# Patient Record
Sex: Female | Born: 1970 | Race: Black or African American | Hispanic: No | Marital: Married | Smoking: Never smoker
Health system: Southern US, Community
[De-identification: ages and names within clinical notes are randomized; demographics above are authoritative.]

## PROBLEM LIST (undated history)

## (undated) DIAGNOSIS — Z8619 Personal history of other infectious and parasitic diseases: Secondary | ICD-10-CM

## (undated) DIAGNOSIS — F419 Anxiety disorder, unspecified: Secondary | ICD-10-CM

## (undated) DIAGNOSIS — E119 Type 2 diabetes mellitus without complications: Secondary | ICD-10-CM

## (undated) DIAGNOSIS — E785 Hyperlipidemia, unspecified: Secondary | ICD-10-CM

## (undated) DIAGNOSIS — J309 Allergic rhinitis, unspecified: Secondary | ICD-10-CM

## (undated) HISTORY — DX: Allergic rhinitis, unspecified: J30.9

## (undated) HISTORY — DX: Personal history of other infectious and parasitic diseases: Z86.19

## (undated) HISTORY — PX: PLEURAL SCARIFICATION: SHX748

## (undated) HISTORY — DX: Type 2 diabetes mellitus without complications: E11.9

## (undated) HISTORY — DX: Anxiety disorder, unspecified: F41.9

## (undated) HISTORY — DX: Hyperlipidemia, unspecified: E78.5

---

## 2016-10-15 LAB — HM PAP SMEAR

## 2017-03-15 ENCOUNTER — Ambulatory Visit: Payer: Self-pay | Admitting: Internal Medicine

## 2017-03-17 ENCOUNTER — Encounter: Payer: Self-pay | Admitting: Internal Medicine

## 2017-03-17 ENCOUNTER — Ambulatory Visit (INDEPENDENT_AMBULATORY_CARE_PROVIDER_SITE_OTHER): Payer: 59 | Admitting: Internal Medicine

## 2017-03-17 ENCOUNTER — Other Ambulatory Visit: Payer: Self-pay | Admitting: Internal Medicine

## 2017-03-17 VITALS — BP 116/78 | HR 81 | Temp 98.3°F | Wt 140.0 lb

## 2017-03-17 DIAGNOSIS — R232 Flushing: Secondary | ICD-10-CM | POA: Diagnosis not present

## 2017-03-17 DIAGNOSIS — Z794 Long term (current) use of insulin: Secondary | ICD-10-CM

## 2017-03-17 DIAGNOSIS — E7800 Pure hypercholesterolemia, unspecified: Secondary | ICD-10-CM

## 2017-03-17 DIAGNOSIS — E119 Type 2 diabetes mellitus without complications: Secondary | ICD-10-CM | POA: Diagnosis not present

## 2017-03-17 DIAGNOSIS — F419 Anxiety disorder, unspecified: Secondary | ICD-10-CM | POA: Diagnosis not present

## 2017-03-17 DIAGNOSIS — Z1239 Encounter for other screening for malignant neoplasm of breast: Secondary | ICD-10-CM

## 2017-03-17 DIAGNOSIS — E785 Hyperlipidemia, unspecified: Secondary | ICD-10-CM | POA: Insufficient documentation

## 2017-03-17 DIAGNOSIS — E1169 Type 2 diabetes mellitus with other specified complication: Secondary | ICD-10-CM | POA: Insufficient documentation

## 2017-03-17 DIAGNOSIS — Z23 Encounter for immunization: Secondary | ICD-10-CM

## 2017-03-17 NOTE — Progress Notes (Signed)
HPI  Pt presents to the clinic today to establish care and for management of the conditions listed below. She is transferring care from [facility].  Anxiety: She is not sure what triggers her anxiety but reports it is very bad during periods of stress. She takes Clonazepam as needed. She reports 30 tablets of Clonazepam lasts her a whole year usually.  DM 2: Her last A1C was 7.4%. She is taking Metformin, Lantus and Humalog as prescribed. She is on Lisinopril for renal protection. She denies neuropathic pain at this time. She gets an eye exam yearly.  HLD: She denies myalgias on Lovastatin. She consumes a low fat diet.  Hot Flashes; Controlled with Gabapentin.  Flu: 03/2016 Tetanus: 2013 Pneumovax: 2015 Mammogram: 06/2014 Pap Smear: Vision Screening: annually Dentist: annually  Past Medical History:  Diagnosis Date  . Anxiety   . Diabetes mellitus without complication ([place])   . History of chicken pox   . Hyperlipidemia     Current Outpatient Prescriptions  Medication Sig Dispense Refill  . cholecalciferol (VITAMIN D) 400 units TABS tablet Take 400 Units by mouth.    . clonazePAM (KLONOPIN) 0.5 MG tablet Take 0.5 mg by mouth 2 (two) times daily as needed.   0  . gabapentin (NEURONTIN) 100 MG capsule Take 300 capsules by mouth at bedtime.   3  . glucose blood (ONE TOUCH ULTRA TEST) test strip Use 2 (two) times daily.    . insulin lispro (HUMALOG) 100 UNIT/ML KiwkPen Inject 5-7 Units into the skin daily with supper.     Marland Kitchen LANTUS SOLOSTAR 100 UNIT/ML Solostar Pen Inject 14 Units into the skin daily at 10 pm.   0  . lisinopril (PRINIVIL,ZESTRIL) 10 MG tablet Take by mouth.    . lovastatin (MEVACOR) 40 MG tablet Take 1 tablet by mouth at bedtime.    . metFORMIN (GLUCOPHAGE) 500 MG tablet Take 2 tablets by mouth 2 (two) times daily with a meal.     No current facility-administered medications for this visit.     No Known Allergies  Family History  Problem Relation Age of  Onset  . Arthritis Mother   . Heart disease Mother   . Hypertension Mother   . Hyperlipidemia Mother   . Dementia Mother   . Hypertension Father   . Hypertension Sister   . Glaucoma Brother   . Asthma Maternal Grandmother   . Stomach cancer Maternal Grandfather   . Arthritis Paternal Grandmother     Social History   Social History  . Marital status: Married    Spouse name: N/A  . Number of children: N/A  . Years of education: N/A   Occupational History  . Not on file.   Social History Main Topics  . Smoking status: Never Smoker  . Smokeless tobacco: Never Used  . Alcohol use Yes     Comment: occasional  . Drug use: No  . Sexual activity: Not on file   Other Topics Concern  . Not on file   Social History Narrative  . No narrative on file    ROS:  Constitutional: Denies fever, malaise, fatigue, headache or abrupt weight changes.  HEENT: Denies eye pain, eye redness, ear pain, ringing in the ears, wax buildup, runny nose, nasal congestion, bloody nose, or sore throat. Respiratory: Denies difficulty breathing, shortness of breath, cough or sputum production.   Cardiovascular: Denies chest pain, chest tightness, palpitations or swelling in the hands or feet.  Gastrointestinal: Denies abdominal pain, bloating, constipation, diarrhea  or blood in the stool.  GU: Denies frequency, urgency, pain with urination, blood in urine, odor or discharge. Musculoskeletal: Denies decrease in range of motion, difficulty with gait, muscle pain or joint pain and swelling.  Skin: Denies redness, rashes, lesions or ulcercations.  Neurological: Pt reports hot flashes. Denies dizziness, difficulty with memory, difficulty with speech or problems with balance and coordination.  Psych: Pt reports anxiety. Denies depression, SI/HI.  No other specific complaints in a complete review of systems (except as listed in HPI above).  PE:  BP 116/78   Pulse 81   Temp 98.3 F (36.8 C) (Oral)   Wt  140 lb (63.5 kg)   LMP 09/21/2016   SpO2 98%  Wt Readings from Last 3 Encounters:  03/17/17 140 lb (63.5 kg)    General: Appears her stated age, well developed, well nourished in NAD. Skin: Dry and intact. No ulcerations noted. Cardiovascular: Normal rate and rhythm. S1,S2 noted.  No murmur, rubs or gallops noted.  Pulmonary/Chest: Normal effort and positive vesicular breath sounds. No respiratory distress. No wheezes, rales or ronchi noted.  Neurological: Alert and oriented.  Psychiatric: Mood and affect normal. Behavior is normal. Judgment and thought content normal.     Assessment and Plan:

## 2017-03-17 NOTE — Assessment & Plan Note (Signed)
A1C today No microalbumin secondary to ACEI therapy Encouraged her to consume a low carb, low fat diet Continue Metformin, Humalog and Lantus Encouraged yearly foot exam Encouraged yearly eye exam Flu shot today Pneumovax UTD

## 2017-03-17 NOTE — Assessment & Plan Note (Signed)
Continue Gabapentin as needed

## 2017-03-17 NOTE — Patient Instructions (Signed)

## 2017-03-17 NOTE — Assessment & Plan Note (Signed)
Encouraged her to consume a low fat diet Continue Lovastatin for now

## 2017-03-17 NOTE — Assessment & Plan Note (Signed)
Controlled on Clonazepam prn

## 2017-03-18 LAB — HEMOGLOBIN A1C: HEMOGLOBIN A1C: 8.4 % — AB (ref 4.6–6.5)

## 2017-03-31 ENCOUNTER — Ambulatory Visit
Admission: RE | Admit: 2017-03-31 | Discharge: 2017-03-31 | Disposition: A | Discharge status: Home / Self Care | Payer: 59 | Source: Ambulatory Visit | Attending: Internal Medicine | Admitting: Internal Medicine

## 2017-03-31 DIAGNOSIS — Z1231 Encounter for screening mammogram for malignant neoplasm of breast: Secondary | ICD-10-CM | POA: Insufficient documentation

## 2017-03-31 DIAGNOSIS — Z1239 Encounter for other screening for malignant neoplasm of breast: Secondary | ICD-10-CM

## 2017-05-23 ENCOUNTER — Encounter: Payer: Self-pay | Admitting: Internal Medicine

## 2017-05-24 MED ORDER — METFORMIN HCL 1000 MG PO TABS: 1000.0000 mg | ORAL_TABLET | Freq: Two times a day (BID) | ORAL | 1 refills | Status: DC

## 2017-05-24 MED ORDER — LISINOPRIL 10 MG PO TABS: 10.0000 mg | ORAL_TABLET | Freq: Every day | ORAL | 1 refills | Status: DC

## 2017-05-24 MED ORDER — INSULIN LISPRO 100 UNIT/ML (KWIKPEN): 5.0000 [IU] | PEN_INJECTOR | Freq: Every day | SUBCUTANEOUS | 1 refills | Status: DC

## 2017-05-24 MED ORDER — LANTUS SOLOSTAR 100 UNIT/ML ~~LOC~~ SOPN: 16.0000 [IU] | PEN_INJECTOR | Freq: Every day | SUBCUTANEOUS | 1 refills | Status: DC

## 2017-05-24 MED ORDER — CLONAZEPAM 0.5 MG PO TABS
0.5000 mg | ORAL_TABLET | Freq: Two times a day (BID) | ORAL | 0 refills | Status: DC | PRN
Start: 2017-05-24 — End: 2017-12-08

## 2017-05-24 MED ORDER — GABAPENTIN 100 MG PO CAPS: 300.0000 mg | ORAL_CAPSULE | Freq: Every day | ORAL | 4 refills | Status: DC

## 2017-05-24 NOTE — Telephone Encounter (Signed)
Ok to phone in Clonazepam

## 2017-05-24 NOTE — Telephone Encounter (Signed)
Rx called in to requested pharmacy 

## 2017-05-24 NOTE — Telephone Encounter (Signed)
New pt to you 02/2017... Please advise if okay to refill... It does not look like it has been filled by you yet

## 2017-06-02 ENCOUNTER — Encounter: Payer: Self-pay | Admitting: Obstetrics and Gynecology

## 2017-06-02 ENCOUNTER — Ambulatory Visit (INDEPENDENT_AMBULATORY_CARE_PROVIDER_SITE_OTHER): Payer: 59 | Admitting: Obstetrics and Gynecology

## 2017-06-02 VITALS — BP 122/78 | Ht 68.0 in | Wt 144.0 lb

## 2017-06-02 DIAGNOSIS — R232 Flushing: Secondary | ICD-10-CM | POA: Diagnosis not present

## 2017-06-02 MED ORDER — PROGESTERONE MICRONIZED 200 MG PO CAPS: 200.0000 mg | ORAL_CAPSULE | Freq: Every day | ORAL | 2 refills | Status: DC

## 2017-06-02 MED ORDER — ESTRADIOL 0.025 MG/24HR TD PTWK: 0.0250 mg | MEDICATED_PATCH | TRANSDERMAL | 2 refills | Status: DC

## 2017-06-02 NOTE — Progress Notes (Signed)
Obstetrics & Gynecology Office Visit   Chief Complaint  Patient presents with  . Menopause   History of Present Illness: 46 y.o. G0P0000 female who presents with moderate-severe hot flushes.  She had her last menses in April this past year.  Since that time she has had fairly severe hot flashes.  Her PCP placed her on gabapentin due to some foot pain thought to be peripheral neuropathy, related to her diabetes.  She found that a dose of up to 300 mg of gabapentin would help with her hot flush symptoms.  This happened for a short while.  However, the symptoms returned and are very difficult to manage.  The patient has diabetes and according to her report, her diabetes is usually well-controlled.  She is taking an ACE inhibitor medication due to microalbuminuria. However, she has no hypertension.  She denies issues with liver disease. She has never had a stroke and denies CHD.  She has no history of breast cancer.    Past Medical History:  Diagnosis Date  . Anxiety   . Diabetes mellitus without complication ([place])   . History of chicken pox   . Hyperlipidemia     Past Surgical History:  Procedure Laterality Date  . PLEURAL SCARIFICATION      Gynecologic History: No LMP recorded. Patient is not currently having periods (Reason: Irregular Periods).  Obstetric History: G0P0000  Family History  Problem Relation Age of Onset  . Arthritis Mother   . Heart disease Mother   . Hypertension Mother   . Hyperlipidemia Mother   . Dementia Mother   . Hypertension Father   . Hypertension Sister   . Glaucoma Brother   . Asthma Maternal Grandmother   . Stomach cancer Maternal Grandfather   . Arthritis Paternal Grandmother   . Breast cancer Neg Hx     Social History   Socioeconomic History  . Marital status: Married    Spouse name: Not on file  . Number of children: Not on file  . Years of education: Not on file  . Highest education level: Not on file  Social Needs  . Financial  resource strain: Not on file  . Food insecurity - worry: Not on file  . Food insecurity - inability: Not on file  . Transportation needs - medical: Not on file  . Transportation needs - non-medical: Not on file  Occupational History  . Not on file  Tobacco Use  . Smoking status: Never Smoker  . Smokeless tobacco: Never Used  Substance and Sexual Activity  . Alcohol use: Yes    Comment: occasional  . Drug use: No  . Sexual activity: Yes  Other Topics Concern  . Not on file  Social History Narrative  . Not on file   Allergies: No Known Allergies  Medications   Medication Sig Start Date End Date Taking? Authorizing Provider  cholecalciferol (VITAMIN D) 400 units TABS tablet Take 400 Units by mouth.    [provider]  clonazePAM (KLONOPIN) 0.5 MG tablet Take 1 tablet (0.5 mg total) by mouth 2 (two) times daily as needed. 05/24/17   Jearld Fenton, NP  gabapentin (NEURONTIN) 100 MG capsule Take 3 capsules (300 mg total) by mouth at bedtime. 05/24/17   Jearld Fenton, NP  glucose blood (ONE TOUCH ULTRA TEST) test strip Use 2 (two) times daily. 06/23/16   [provider]  insulin lispro (HUMALOG) 100 UNIT/ML KiwkPen Inject 0.05-0.07 mLs (5-7 Units total) into the skin daily with supper.  05/24/17   Jearld Fenton, NP  LANTUS SOLOSTAR 100 UNIT/ML Solostar Pen Inject 16 Units into the skin daily at 10 pm. 05/24/17   Jearld Fenton, NP  lisinopril (PRINIVIL,ZESTRIL) 10 MG tablet Take 1 tablet (10 mg total) by mouth daily. 05/24/17   Jearld Fenton, NP  lovastatin (MEVACOR) 40 MG tablet Take 1 tablet by mouth at bedtime. 06/23/16   [provider]  metFORMIN (GLUCOPHAGE) 1000 MG tablet Take 1 tablet (1,000 mg total) by mouth 2 (two) times daily with a meal. 05/24/17   Baity, Coralie Keens, NP   Review of Systems  Constitutional: Negative.        Occasional hot flushes  HENT: Negative.   Eyes: Negative.   Respiratory: Negative.   Cardiovascular: Negative.     Gastrointestinal: Negative.   Genitourinary: Negative.   Musculoskeletal: Negative.   Skin: Negative.   Neurological: Negative.   Psychiatric/Behavioral: Negative.      Physical Exam BP 122/78   Ht 5' 8"  (1.727 m)   Wt 144 lb (65.3 kg)   BMI 21.90 kg/m  No LMP recorded. Patient is not currently having periods (Reason: Irregular Periods). Physical Exam  Constitutional: She is oriented to person, place, and time. She appears well-developed and well-nourished. No distress.  HENT:  Head: Normocephalic and atraumatic.  Neurological: She is alert and oriented to person, place, and time. No cranial nerve deficit.  Psychiatric: She has a normal mood and affect. Her behavior is normal. Judgment normal.   Assessment: 46 y.o. G0P0000 female here for  1. Hot flashes      Plan: Problem List Items Addressed This Visit    Hot flashes - Primary   Relevant Medications   progesterone (PROMETRIUM) 200 MG capsule   estradiol (CLIMARA) 0.025 mg/24hr patch     30 minutes spent in face to face discussion with > 50% spent in counseling, management, and coordination of care of her hot flashes and climacteric symptoms.  She understands the various options available for treatment and has tried gabapentin already. She understands the SSRIs, SNRIs, clonidine, and herbal products available.  We discussed, briefly, some of the non-hot-flash benefits/risks of the medication.  Her 10-year CHD risk is 3.87% strictly speaking, but she does not have hypertension specifically. She is well-below the threshold of 10% 10-year risk.  Will start her off with a low-dose estrogen patch with add-back oral progesterone therapy for endometrial protection.  Will have her follow up and let me know how her symptoms are responding in about 1 month, at which time her dosing may need to be increased to relieve her symptoms.  All questions answered.   Prentice Docker, MD 06/03/2017 1:15 PM

## 2017-06-03 ENCOUNTER — Encounter: Payer: Self-pay | Admitting: Obstetrics and Gynecology

## 2017-07-11 ENCOUNTER — Encounter: Payer: Self-pay | Admitting: Obstetrics and Gynecology

## 2017-07-21 ENCOUNTER — Encounter: Payer: 59 | Admitting: Internal Medicine

## 2017-08-18 ENCOUNTER — Other Ambulatory Visit: Payer: Self-pay | Admitting: Obstetrics and Gynecology

## 2017-08-18 DIAGNOSIS — R232 Flushing: Secondary | ICD-10-CM

## 2017-08-25 ENCOUNTER — Telehealth: Payer: Self-pay

## 2017-08-25 ENCOUNTER — Other Ambulatory Visit: Payer: Self-pay | Admitting: Advanced Practice Midwife

## 2017-08-25 ENCOUNTER — Ambulatory Visit: Payer: Self-pay | Admitting: Family Medicine

## 2017-08-25 DIAGNOSIS — R232 Flushing: Secondary | ICD-10-CM

## 2017-08-25 MED ORDER — PROGESTERONE MICRONIZED 200 MG PO CAPS: 200.0000 mg | ORAL_CAPSULE | Freq: Every day | ORAL | 0 refills | Status: DC

## 2017-08-25 MED ORDER — ESTRADIOL 0.025 MG/24HR TD PTWK: 0.0250 mg | MEDICATED_PATCH | TRANSDERMAL | 0 refills | Status: DC

## 2017-08-25 NOTE — Telephone Encounter (Signed)
Pt now has appt sched for 3/13th.  Needs refill of estrogen patch and progesterone.  [phone] (pharm correct in chart)

## 2017-08-25 NOTE — Progress Notes (Signed)
Rx sent for estrogen patch and progesterone capsules enough until next appointment. Will need further refills at appointment time.

## 2017-08-25 NOTE — Telephone Encounter (Signed)
Spoke with patient and let her know that Rx is at her pharmacy.

## 2017-08-25 NOTE — Telephone Encounter (Signed)
08/25/2017- provider called in sick. Called pt to reschedule new pt appointment. States she can only come in on Wednesdays, and next available Wednesday is 10/06/2017 (appt scheduled). Pt needed refill on medications- Lovastatin 40 mg, and estradiol 0.025 mg/24hr patch. [facility]. OK to refill? Pt aware this will wait until provider's return.

## 2017-08-26 MED ORDER — LOVASTATIN 40 MG PO TABS: 40.0000 mg | ORAL_TABLET | Freq: Every day | ORAL | 0 refills | Status: DC

## 2017-08-26 MED ORDER — ESTRADIOL 0.025 MG/24HR TD PTWK: 0.0250 mg | MEDICATED_PATCH | TRANSDERMAL | 0 refills | Status: DC

## 2017-08-26 NOTE — Telephone Encounter (Signed)
I'm ok with refilling meds for 1 month supply until she can get in (Rx sent to pharmacy).  She also could get scheduled in an 8 AM slot (as an extra new patient for the day on next Wednesday if she wants to come in sooner, as it was my fault her appt was rescheduled.  Virginia Crews, MD, MPH [facility] 08/26/2017 8:17 AM

## 2017-08-26 NOTE — Telephone Encounter (Signed)
Left message advising pt.

## 2017-09-01 ENCOUNTER — Encounter: Payer: Self-pay | Admitting: Obstetrics and Gynecology

## 2017-09-01 ENCOUNTER — Ambulatory Visit: Payer: 59 | Admitting: Obstetrics and Gynecology

## 2017-09-01 VITALS — BP 128/88 | Ht 68.0 in | Wt 145.0 lb

## 2017-09-01 DIAGNOSIS — R232 Flushing: Secondary | ICD-10-CM | POA: Diagnosis not present

## 2017-09-01 MED ORDER — ESTRADIOL 0.025 MG/24HR TD PTWK: 0.0250 mg | MEDICATED_PATCH | TRANSDERMAL | 11 refills | Status: DC

## 2017-09-01 MED ORDER — PROGESTERONE MICRONIZED 200 MG PO CAPS: 200.0000 mg | ORAL_CAPSULE | Freq: Every day | ORAL | 11 refills | Status: DC

## 2017-09-01 NOTE — Progress Notes (Signed)
Obstetrics & Gynecology Office Visit   Chief Complaint  Patient presents with  . Follow-up  hot flash medication management  History of Present Illness: 47 y.o. G0P0000 female who was started on prometrium and estradiol (0.015m/24h patch) in December.  She reports that the medication has been working very well.  She states her symptoms are improved.  Her number of hot flashes is greatly reduced. She has more hot flashes during the treatment with progesterone.  She has no side effects from the medications. She had a withdrawal bleed after her first round of taking the progesterone in January.  She would like to continue with her current dose.  Past Medical History:  Diagnosis Date  . Anxiety   . Diabetes mellitus without complication ([place]   . History of chicken pox   . Hyperlipidemia     Past Surgical History:  Procedure Laterality Date  . PLEURAL SCARIFICATION      Gynecologic History: No LMP recorded. Patient is not currently having periods (Reason: Irregular Periods).  Obstetric History: G0P0000  Family History  Problem Relation Age of Onset  . Arthritis Mother   . Heart disease Mother   . Hypertension Mother   . Hyperlipidemia Mother   . Dementia Mother   . Hypertension Father   . Hypertension Sister   . Glaucoma Brother   . Asthma Maternal Grandmother   . Stomach cancer Maternal Grandfather   . Arthritis Paternal Grandmother   . Breast cancer Neg Hx     Social History   Socioeconomic History  . Marital status: Married    Spouse name: Not on file  . Number of children: Not on file  . Years of education: Not on file  . Highest education level: Not on file  Social Needs  . Financial resource strain: Not on file  . Food insecurity - worry: Not on file  . Food insecurity - inability: Not on file  . Transportation needs - medical: Not on file  . Transportation needs - non-medical: Not on file  Occupational History  . Not on file  Tobacco Use  . Smoking  status: Never Smoker  . Smokeless tobacco: Never Used  Substance and Sexual Activity  . Alcohol use: Yes    Comment: occasional  . Drug use: No  . Sexual activity: Yes  Other Topics Concern  . Not on file  Social History Narrative  . Not on file   Allergies: No Known Allergies  Prior to Admission medications   Medication Sig Start Date End Date Taking? Authorizing Provider  cholecalciferol (VITAMIN D) 400 units TABS tablet Take 400 Units by mouth.    [provider]  clonazePAM (KLONOPIN) 0.5 MG tablet Take 1 tablet (0.5 mg total) by mouth 2 (two) times daily as needed. 05/24/17   BJearld Fenton NP  estradiol (CLIMARA) 0.025 mg/24hr patch Place 1 patch (0.025 mg total) onto the skin once a week. 08/26/17   Bacigalupo, ADionne Bucy MD  gabapentin (NEURONTIN) 100 MG capsule Take 3 capsules (300 mg total) by mouth at bedtime. 05/24/17   BJearld Fenton NP  glucose blood (ONE TOUCH ULTRA TEST) test strip Use 2 (two) times daily. 06/23/16   [provider]  insulin lispro (HUMALOG) 100 UNIT/ML KiwkPen Inject 0.05-0.07 mLs (5-7 Units total) into the skin daily with supper. 05/24/17   BJearld Fenton NP  LANTUS SOLOSTAR 100 UNIT/ML Solostar Pen Inject 16 Units into the skin daily at 10 pm. 05/24/17   BJearld Fenton  NP  lisinopril (PRINIVIL,ZESTRIL) 10 MG tablet Take 1 tablet (10 mg total) by mouth daily. 05/24/17   Jearld Fenton, NP  lovastatin (MEVACOR) 40 MG tablet Take 1 tablet (40 mg total) by mouth at bedtime. 08/26/17   Virginia Crews, MD  metFORMIN (GLUCOPHAGE) 1000 MG tablet Take 1 tablet (1,000 mg total) by mouth 2 (two) times daily with a meal. 05/24/17   Baity, Coralie Keens, NP  progesterone (PROMETRIUM) 200 MG capsule Take 1 capsule (200 mg total) by mouth daily. Take calendar days 1-12 each month 08/25/17   Rod Can, CNM    Review of Systems  Constitutional: Negative.   HENT: Negative.   Eyes: Negative.   Respiratory: Negative.   Cardiovascular: Negative.     Gastrointestinal: Negative.   Genitourinary: Negative.   Musculoskeletal: Negative.   Skin: Negative.   Neurological: Negative.   Psychiatric/Behavioral: Negative.      Physical Exam BP 128/88   Ht 5' 8"  (1.727 m)   Wt 145 lb (65.8 kg)   BMI 22.05 kg/m  No LMP recorded. Patient is not currently having periods (Reason: Irregular Periods). Physical Exam  Constitutional: She is oriented to person, place, and time. She appears well-developed and well-nourished. No distress.  HENT:  Head: Normocephalic and atraumatic.  Eyes: Conjunctivae are normal. No scleral icterus.  Pulmonary/Chest: Effort normal. No respiratory distress.  Musculoskeletal: Normal range of motion. She exhibits no edema.  Neurological: She is alert and oriented to person, place, and time. No cranial nerve deficit.  Psychiatric: She has a normal mood and affect. Her behavior is normal. Judgment normal.   Assessment: 47 y.o. G0P0000 female here for  1. Hot flashes      Plan: Problem List Items Addressed This Visit      Cardiovascular and Mediastinum   Hot flashes - Primary   Relevant Medications   estradiol (CLIMARA) 0.025 mg/24hr patch   progesterone (PROMETRIUM) 200 MG capsule     15 minutes spent in face to face discussion with > 50% spent in counseling,management, and coordination of care of her hot flashes.  1 year supply given for both medications.  Return in about 1 year (around 09/02/2018) for follow up medication management.  Prentice Docker, MD 09/01/2017 9:36 AM

## 2017-10-06 ENCOUNTER — Ambulatory Visit: Payer: 59 | Admitting: Family Medicine

## 2017-10-06 ENCOUNTER — Encounter: Payer: Self-pay | Admitting: Family Medicine

## 2017-10-06 VITALS — BP 122/84 | HR 68 | Temp 97.7°F | Resp 16 | Ht 68.0 in | Wt 145.0 lb

## 2017-10-06 DIAGNOSIS — F419 Anxiety disorder, unspecified: Secondary | ICD-10-CM

## 2017-10-06 DIAGNOSIS — E785 Hyperlipidemia, unspecified: Secondary | ICD-10-CM | POA: Insufficient documentation

## 2017-10-06 DIAGNOSIS — E119 Type 2 diabetes mellitus without complications: Secondary | ICD-10-CM

## 2017-10-06 DIAGNOSIS — E7800 Pure hypercholesterolemia, unspecified: Secondary | ICD-10-CM | POA: Diagnosis not present

## 2017-10-06 DIAGNOSIS — Z794 Long term (current) use of insulin: Secondary | ICD-10-CM | POA: Diagnosis not present

## 2017-10-06 DIAGNOSIS — J309 Allergic rhinitis, unspecified: Secondary | ICD-10-CM | POA: Insufficient documentation

## 2017-10-06 DIAGNOSIS — J301 Allergic rhinitis due to pollen: Secondary | ICD-10-CM | POA: Diagnosis not present

## 2017-10-06 DIAGNOSIS — R232 Flushing: Secondary | ICD-10-CM | POA: Diagnosis not present

## 2017-10-06 LAB — POCT GLYCOSYLATED HEMOGLOBIN (HGB A1C)
Est. average glucose Bld gHb Est-mCnc: 169
HEMOGLOBIN A1C: 7.5

## 2017-10-06 MED ORDER — LISINOPRIL 10 MG PO TABS: 10.0000 mg | ORAL_TABLET | Freq: Every day | ORAL | 3 refills | Status: DC

## 2017-10-06 MED ORDER — GABAPENTIN 400 MG PO CAPS: 400.0000 mg | ORAL_CAPSULE | Freq: Every day | ORAL | 3 refills | Status: DC

## 2017-10-06 MED ORDER — LANTUS SOLOSTAR 100 UNIT/ML ~~LOC~~ SOPN: 14.0000 [IU] | PEN_INJECTOR | Freq: Every day | SUBCUTANEOUS | 1 refills | Status: DC

## 2017-10-06 MED ORDER — DULAGLUTIDE 0.75 MG/0.5ML ~~LOC~~ SOAJ: 0.7500 mg | SUBCUTANEOUS | 3 refills | Status: DC

## 2017-10-06 NOTE — Assessment & Plan Note (Signed)
Well-controlled Continue daily Zyrtec

## 2017-10-06 NOTE — Assessment & Plan Note (Signed)
Well-controlled Patient declines SSRI at this time Continue clonazepam as needed with using sparingly

## 2017-10-06 NOTE — Progress Notes (Signed)
Patient: Lynn Coleman, Female    DOB: 1971-06-10, 47 y.o.   MRN: [id number] Visit Date: 10/06/2017  Today's Provider: Lavon Paganini, MD   I, Martha Clan, CMA, am acting as scribe for Lavon Paganini, MD.  Chief Complaint  Patient presents with  . Establish Care   Subjective:    Establish Care Lynn Coleman is a 47 y.o. female who presents today to establish care. She feels well. She reports exercising 3 days per week for 30 minutes on the elliptical. She reports she is sleeping fairly well.  Pt's PMH includes DM 2, hypercholesterolemia, anxiety, microalbuminuria. She states she takes Gabapentin for hot flashes, and would like to increase this dose.  T2DM - Medications: Metformin 1048m BID, Lantus 14 units qhs, Humalog 5-7 units 1-3 times daily with meals (usually only with dinner which is her largest meal) - Compliance: good - Diet: low carb - eye exam: scheduled for next month with [facility] - foot exam: needs - microalbumin: n/a on ACEi - denies symptoms of hypoglycemia, polyuria, polydipsia, numbness extremities, foot ulcers/trauma  Anxiety: Previously taking Lexapro in times of high stress and anxiety.  Anxiety is pretty stable currently.  Using Klonopin irregularly for high anxiety times.  Perimenopausal with vasomotor symptoms: Taking Gabapentin 3013mqhs, which helps some but not completely.  GYN is prescribing HRT.  Patient has intact uterus.  Last pap- 10/15/2016- NIL Last mammogram- 03/31/2017- BI-RADS 1 -----------------------------------------------------------------   Review of Systems  Constitutional: Negative.   HENT: Negative.   Eyes: Negative.   Respiratory: Negative.   Cardiovascular: Negative.   Gastrointestinal: Negative.   Endocrine: Negative.   Genitourinary: Negative.   Musculoskeletal: Positive for back pain. Negative for arthralgias, gait problem, joint swelling, myalgias, neck pain and neck stiffness.  Skin:  Negative.   Allergic/Immunologic: Negative.   Neurological: Negative.   Hematological: Negative.   Psychiatric/Behavioral: Negative.     Social History      She  reports that she has never smoked. She has never used smokeless tobacco. She reports that she drinks alcohol. She reports that she does not use drugs.       Social History   Socioeconomic History  . Marital status: Married    Spouse name: Darrell  . Number of children: 0  . Years of education: Not on file  . Highest education level: Doctorate  Occupational History    Employer: [employer]  Social Needs  . Financial resource strain: Not hard at all  . Food insecurity:    Worry: Never true    Inability: Never true  . Transportation needs:    Medical: No    Non-medical: No  Tobacco Use  . Smoking status: Never Smoker  . Smokeless tobacco: Never Used  Substance and Sexual Activity  . Alcohol use: Yes    Comment: occasional  . Drug use: No  . Sexual activity: Yes  Lifestyle  . Physical activity:    Days per week: 3 days    Minutes per session: 30 min  . Stress: Not on file  Relationships  . Social connections:    Talks on phone: Not on file    Gets together: Not on file    Attends religious service: Not on file    Active member of club or organization: Not on file    Attends meetings of clubs or organizations: Not on file    Relationship status: Not on file  Other Topics Concern  . Not on file  Social History Narrative  . Not on file    Past Medical History:  Diagnosis Date  . Anxiety   . Diabetes mellitus without complication ([place])   . History of chicken pox   . Hyperlipidemia      Patient Active Problem List   Diagnosis Date Noted  . Type 2 diabetes mellitus without complication, with long-term current use of insulin ([place]) 03/17/2017  . Anxiety 03/17/2017  . HLD (hyperlipidemia) 03/17/2017  . Hot flashes 03/17/2017    Past Surgical History:  Procedure Laterality Date  . PLEURAL  SCARIFICATION      Family History        Family Status  Relation Name Status  . Mother  Deceased  . Father  Deceased  . Sister  Alive  . Brother  Deceased  . MGM  (Not Specified)  . MGF  (Not Specified)  . PGM  (Not Specified)  . Neg Hx  (Not Specified)        Her family history includes Arthritis in her mother and paternal grandmother; Asthma in her maternal grandmother; Dementia in her mother; Gastric cancer in her maternal grandfather; Glaucoma in her brother; Heart disease in her mother; Hyperlipidemia in her mother; Hypertension in her father, mother, and sister; Lung cancer in her father; Pancreatic cancer in her brother; Stomach cancer in her maternal grandfather. There is no history of Breast cancer.      No Known Allergies   Current Outpatient Medications:  .  cetirizine (ZYRTEC) 10 MG tablet, Take 10 mg by mouth daily., Disp: , Rfl:  .  clonazePAM (KLONOPIN) 0.5 MG tablet, Take 1 tablet (0.5 mg total) by mouth 2 (two) times daily as needed., Disp: 30 tablet, Rfl: 0 .  estradiol (CLIMARA) 0.025 mg/24hr patch, Place 1 patch (0.025 mg total) onto the skin once a week., Disp: 4 patch, Rfl: 11 .  gabapentin (NEURONTIN) 100 MG capsule, Take 3 capsules (300 mg total) by mouth at bedtime., Disp: 90 capsule, Rfl: 4 .  glucose blood (ONE TOUCH ULTRA TEST) test strip, Use 2 (two) times daily., Disp: , Rfl:  .  insulin lispro (HUMALOG) 100 UNIT/ML KiwkPen, Inject 0.05-0.07 mLs (5-7 Units total) into the skin daily with supper. (Patient taking differently: Inject 5-7 Units into the skin 3 (three) times daily as needed. ), Disp: 15 mL, Rfl: 1 .  LANTUS SOLOSTAR 100 UNIT/ML Solostar Pen, Inject 16 Units into the skin daily at 10 pm. (Patient taking differently: Inject 14-15 Units into the skin daily at 10 pm. ), Disp: 15 mL, Rfl: 1 .  lisinopril (PRINIVIL,ZESTRIL) 10 MG tablet, Take 1 tablet (10 mg total) by mouth daily., Disp: 90 tablet, Rfl: 1 .  lovastatin (MEVACOR) 40 MG tablet, Take 1  tablet (40 mg total) by mouth at bedtime., Disp: 30 tablet, Rfl: 0 .  metFORMIN (GLUCOPHAGE) 1000 MG tablet, Take 1 tablet (1,000 mg total) by mouth 2 (two) times daily with a meal., Disp: 180 tablet, Rfl: 1 .  Multiple Vitamin (MULTIVITAMIN) tablet, Take 1 tablet by mouth daily., Disp: , Rfl:  .  progesterone (PROMETRIUM) 200 MG capsule, Take 1 capsule (200 mg total) by mouth daily. Take calendar days 1-12 each month, Disp: 12 capsule, Rfl: 11   Patient Care Team: Virginia Crews, MD as PCP - General (Family Medicine)      Objective:   Vitals: BP 122/84 (BP Location: Left Arm, Patient Position: Sitting, Cuff Size: Normal)   Pulse 68   Temp 97.7 F (36.5 C) (  Oral)   Resp 16   Ht 5' 8"  (1.727 m)   Wt 145 lb (65.8 kg)   BMI 22.05 kg/m    Vitals:   10/06/17 1409  BP: 122/84  Pulse: 68  Resp: 16  Temp: 97.7 F (36.5 C)  TempSrc: Oral  Weight: 145 lb (65.8 kg)  Height: 5' 8"  (1.727 m)     Physical Exam  Constitutional: She is oriented to person, place, and time. She appears well-developed and well-nourished. No distress.  HENT:  Head: Normocephalic and atraumatic.  Right Ear: External ear normal.  Left Ear: External ear normal.  Nose: Nose normal.  Mouth/Throat: Oropharynx is clear and moist.  Eyes: Pupils are equal, round, and reactive to light. Conjunctivae and EOM are normal. No scleral icterus.  Neck: Neck supple. No thyromegaly present.  Cardiovascular: Normal rate, regular rhythm, normal heart sounds and intact distal pulses.  No murmur heard. Pulmonary/Chest: Effort normal and breath sounds normal. No respiratory distress. She has no wheezes. She has no rales.  Abdominal: Soft. She exhibits no distension. There is no tenderness.  Musculoskeletal: She exhibits no edema or deformity.  Lymphadenopathy:    She has no cervical adenopathy.  Neurological: She is alert and oriented to person, place, and time.  Skin: Skin is warm and dry. Capillary refill takes less  than 2 seconds. No rash noted.  Psychiatric: She has a normal mood and affect. Her behavior is normal.  Vitals reviewed.    Depression Screen PHQ 2/9 Scores 03/17/2017  PHQ - 2 Score 0    Results for orders placed or performed in visit on 10/06/17  HM PAP SMEAR  Result Value Ref Range   HM Pap smear NIL- Care Everywhere   POCT glycosylated hemoglobin (Hb A1C)  Result Value Ref Range   Hemoglobin A1C 7.5    Est. average glucose Bld gHb Est-mCnc 169      Assessment & Plan:    Problem List Items Addressed This Visit      Cardiovascular and Mediastinum   Hot flashes    Somewhat controlled but not entirely Increase gabapentin to 400 mg nightly Also discussed with patient option of taking SSRI to help with hot flashes, but she declines at this time      Relevant Medications   lisinopril (PRINIVIL,ZESTRIL) 10 MG tablet     Respiratory   Allergic rhinitis    Well-controlled Continue daily Zyrtec        Endocrine   Type 2 diabetes mellitus without complication, with long-term current use of insulin (HCC) - Primary    Uncontrolled with A1c 7.5 Goal A1c less than 7 On ACE inhibitor Foot exam completed today Has upcoming eye appointment Up-to-date on Pneumovax continue metformin, Humalog, Lantus Encouraged low-carb diet Patient has never taken other non-insulin medications for her diabetes We will start Trulicity low-dose  At follow-up, if tolerating well, consider increase in dose of Trulicity      Relevant Medications   Dulaglutide (TRULICITY) 3.70 WU/8.8BV SOPN   LANTUS SOLOSTAR 100 UNIT/ML Solostar Pen   lisinopril (PRINIVIL,ZESTRIL) 10 MG tablet   Other Relevant Orders   POCT glycosylated hemoglobin (Hb A1C) (Completed)     Other   Anxiety    Well-controlled Patient declines SSRI at this time Continue clonazepam as needed with using sparingly      HLD (hyperlipidemia)    Reviewed previous lipid panel Plan to repeat at upcoming physical when she is  fasting Continue lovastatin at this time  Relevant Medications   lisinopril (PRINIVIL,ZESTRIL) 10 MG tablet       Return in about 6 weeks (around 11/17/2017) for Physical.   The entirety of the information documented in the History of Present Illness, Review of Systems and Physical Exam were personally obtained by me. Portions of this information were initially documented by Raquel Sarna Ratchford, CMA and reviewed by me for thoroughness and accuracy.    Virginia Crews, MD, MPH [facility] 10/06/2017 3:15 PM

## 2017-10-06 NOTE — Assessment & Plan Note (Signed)
Uncontrolled with A1c 7.5 Goal A1c less than 7 On ACE inhibitor Foot exam completed today Has upcoming eye appointment Up-to-date on Pneumovax continue metformin, Humalog, Lantus Encouraged low-carb diet Patient has never taken other non-insulin medications for her diabetes We will start Trulicity low-dose  At follow-up, if tolerating well, consider increase in dose of Trulicity

## 2017-10-06 NOTE — Assessment & Plan Note (Signed)
Somewhat controlled but not entirely Increase gabapentin to 400 mg nightly Also discussed with patient option of taking SSRI to help with hot flashes, but she declines at this time

## 2017-10-06 NOTE — Assessment & Plan Note (Signed)
Reviewed previous lipid panel Plan to repeat at upcoming physical when she is fasting Continue lovastatin at this time

## 2017-10-13 ENCOUNTER — Telehealth: Payer: Self-pay

## 2017-10-13 NOTE — Telephone Encounter (Signed)
Patient is calling to get update on pre-authorization for Trulicity? Patient states that [facility] said they faxed pre-auth twice to our office. KW

## 2017-10-13 NOTE — Telephone Encounter (Signed)
PA has been started. Am waiting for response. Pt advised.

## 2017-10-14 NOTE — Telephone Encounter (Signed)
Approved. Pharmacy and pt advised.

## 2017-11-09 ENCOUNTER — Telehealth: Payer: Self-pay | Admitting: Family Medicine

## 2017-11-09 NOTE — Telephone Encounter (Signed)
Pt was returning call about rescheduling her CPE on 11/24/17. Pt stated that she is a [employer] employee and she needs her CPE done on a Wednesday before the end of June due to insurance. Pt is requesting to be worked into Dr. Sharmaine Base schedule for CPE on a Wednesday in June. Please advise. Thanks TNP

## 2017-11-09 NOTE — Telephone Encounter (Signed)
Scheduled appointment ot 10:40 on 12/08/2017 for cpe. Left message advising pt and asked her to call back if there was a conflict with this time.

## 2017-11-09 NOTE — Telephone Encounter (Signed)
Please review

## 2017-11-09 NOTE — Telephone Encounter (Signed)
OK to fit her into any 40 minute slot even if there are other CPEs or new patients that day.  Virginia Crews, MD, MPH [facility] [place] 11/09/2017 10:18 AM

## 2017-11-17 ENCOUNTER — Other Ambulatory Visit: Payer: Self-pay | Admitting: Family Medicine

## 2017-11-24 ENCOUNTER — Encounter: Payer: 59 | Admitting: Family Medicine

## 2017-11-24 DIAGNOSIS — H5213 Myopia, bilateral: Secondary | ICD-10-CM | POA: Diagnosis not present

## 2017-11-24 LAB — HM DIABETES EYE EXAM

## 2017-12-08 ENCOUNTER — Encounter: Payer: Self-pay | Admitting: Family Medicine

## 2017-12-08 ENCOUNTER — Ambulatory Visit (INDEPENDENT_AMBULATORY_CARE_PROVIDER_SITE_OTHER): Payer: 59 | Admitting: Family Medicine

## 2017-12-08 VITALS — BP 108/72 | HR 82 | Temp 97.9°F | Resp 16 | Ht 68.0 in | Wt 140.0 lb

## 2017-12-08 DIAGNOSIS — E7800 Pure hypercholesterolemia, unspecified: Secondary | ICD-10-CM | POA: Diagnosis not present

## 2017-12-08 DIAGNOSIS — Z0000 Encounter for general adult medical examination without abnormal findings: Secondary | ICD-10-CM

## 2017-12-08 MED ORDER — CLONAZEPAM 0.5 MG PO TABS: 0.5000 mg | ORAL_TABLET | Freq: Two times a day (BID) | ORAL | 1 refills | Status: DC | PRN

## 2017-12-08 NOTE — Patient Instructions (Signed)
Preventive Care 40-64 Years, Female Preventive care refers to lifestyle choices and visits with your health care provider that can promote health and wellness. What does preventive care include?  A yearly physical exam. This is also called an annual well check.  Dental exams once or twice a year.  Routine eye exams. Ask your health care provider how often you should have your eyes checked.  Personal lifestyle choices, including: ? Daily care of your teeth and gums. ? Regular physical activity. ? Eating a healthy diet. ? Avoiding tobacco and drug use. ? Limiting alcohol use. ? Practicing safe sex. ? Taking low-dose aspirin daily starting at age 58. ? Taking vitamin and mineral supplements as recommended by your health care provider. What happens during an annual well check? The services and screenings done by your health care provider during your annual well check will depend on your age, overall health, lifestyle risk factors, and family history of disease. Counseling Your health care provider may ask you questions about your:  Alcohol use.  Tobacco use.  Drug use.  Emotional well-being.  Home and relationship well-being.  Sexual activity.  Eating habits.  Work and work Statistician.  Method of birth control.  Menstrual cycle.  Pregnancy history.  Screening You may have the following tests or measurements:  Height, weight, and BMI.  Blood pressure.  Lipid and cholesterol levels. These may be checked every 5 years, or more frequently if you are over 81 years old.  Skin check.  Lung cancer screening. You may have this screening every year starting at age 78 if you have a 30-pack-year history of smoking and currently smoke or have quit within the past 15 years.  Fecal occult blood test (FOBT) of the stool. You may have this test every year starting at age 65.  Flexible sigmoidoscopy or colonoscopy. You may have a sigmoidoscopy every 5 years or a colonoscopy  every 10 years starting at age 30.  Hepatitis C blood test.  Hepatitis B blood test.  Sexually transmitted disease (STD) testing.  Diabetes screening. This is done by checking your blood sugar (glucose) after you have not eaten for a while (fasting). You may have this done every 1-3 years.  Mammogram. This may be done every 1-2 years. Talk to your health care provider about when you should start having regular mammograms. This may depend on whether you have a family history of breast cancer.  BRCA-related cancer screening. This may be done if you have a family history of breast, ovarian, tubal, or peritoneal cancers.  Pelvic exam and Pap test. This may be done every 3 years starting at age 80. Starting at age 36, this may be done every 5 years if you have a Pap test in combination with an HPV test.  Bone density scan. This is done to screen for osteoporosis. You may have this scan if you are at high risk for osteoporosis.  Discuss your test results, treatment options, and if necessary, the need for more tests with your health care provider. Vaccines Your health care provider may recommend certain vaccines, such as:  Influenza vaccine. This is recommended every year.  Tetanus, diphtheria, and acellular pertussis (Tdap, Td) vaccine. You may need a Td booster every 10 years.  Varicella vaccine. You may need this if you have not been vaccinated.  Zoster vaccine. You may need this after age 5.  Measles, mumps, and rubella (MMR) vaccine. You may need at least one dose of MMR if you were born in  1958 or later. You may also need a second dose.  Pneumococcal 13-valent conjugate (PCV13) vaccine. You may need this if you have certain conditions and were not previously vaccinated.  Pneumococcal polysaccharide (PPSV23) vaccine. You may need one or two doses if you smoke cigarettes or if you have certain conditions.  Meningococcal vaccine. You may need this if you have certain  conditions.  Hepatitis A vaccine. You may need this if you have certain conditions or if you travel or work in places where you may be exposed to hepatitis A.  Hepatitis B vaccine. You may need this if you have certain conditions or if you travel or work in places where you may be exposed to hepatitis B.  Haemophilus influenzae type b (Hib) vaccine. You may need this if you have certain conditions.  Talk to your health care provider about which screenings and vaccines you need and how often you need them. This information is not intended to replace advice given to you by your health care provider. Make sure you discuss any questions you have with your health care provider. Document Released: 07/05/2015 Document Revised: 02/26/2016 Document Reviewed: 04/09/2015 Elsevier Interactive Patient Education  2018 [employer]  

## 2017-12-08 NOTE — Progress Notes (Signed)
Patient: Lynn Coleman, Female    DOB: 01-31-1971, 47 y.o.   MRN: [id number] Visit Date: 12/08/2017  Today's Provider: Lavon Paganini, MD   I, Martha Clan, CMA, am acting as scribe for Lavon Paganini, MD.  Chief Complaint  Patient presents with  . Annual Exam   Subjective:    Annual physical exam Lynn Coleman is a 47 y.o. female who presents today for health maintenance and complete physical. She feels well. She reports exercising about 3 days weekly for 30 minutes, depending on schedule. She reports she is sleeping well.  Last pap- 04/26//2018- NIL. No history of abnormal pap smears. Last mammogram- 03/31/2017- BI-RADS 1 No family history of colon cancer -----------------------------------------------------------------   Review of Systems  Constitutional: Negative.   HENT: Negative.   Eyes: Negative.   Respiratory: Negative.   Cardiovascular: Negative.   Gastrointestinal: Negative.   Endocrine: Negative.   Genitourinary: Negative.   Musculoskeletal: Positive for back pain. Negative for arthralgias, gait problem, joint swelling, myalgias, neck pain and neck stiffness.  Skin: Negative.   Allergic/Immunologic: Positive for environmental allergies. Negative for food allergies and immunocompromised state.  Neurological: Negative.   Hematological: Negative.   Psychiatric/Behavioral: Positive for sleep disturbance. Negative for agitation, behavioral problems, confusion, decreased concentration, dysphoric mood, hallucinations, self-injury and suicidal ideas. The patient is not nervous/anxious and is not hyperactive.     Social History      She  reports that she has never smoked. She has never used smokeless tobacco. She reports that she drinks alcohol. She reports that she does not use drugs.       Social History   Socioeconomic History  . Marital status: Married    Spouse name: Darrell  . Number of children: 0  . Years of education: Not on file    . Highest education level: Doctorate  Occupational History    Employer: [employer]  Social Needs  . Financial resource strain: Not hard at all  . Food insecurity:    Worry: Never true    Inability: Never true  . Transportation needs:    Medical: No    Non-medical: No  Tobacco Use  . Smoking status: Never Smoker  . Smokeless tobacco: Never Used  Substance and Sexual Activity  . Alcohol use: Yes    Comment: occasional. 1 glass of wine 2-3 times per month  . Drug use: No  . Sexual activity: Yes    Partners: Male    Birth control/protection: Patch  Lifestyle  . Physical activity:    Days per week: 3 days    Minutes per session: 30 min  . Stress: Not on file  Relationships  . Social connections:    Talks on phone: Not on file    Gets together: Not on file    Attends religious service: Not on file    Active member of club or organization: Not on file    Attends meetings of clubs or organizations: Not on file    Relationship status: Not on file  Other Topics Concern  . Not on file  Social History Narrative  . Not on file    Past Medical History:  Diagnosis Date  . Allergic rhinitis   . Anxiety   . Diabetes mellitus without complication ([place])   . History of chicken pox   . Hyperlipidemia      Patient Active Problem List   Diagnosis Date Noted  . Allergic rhinitis   . Type 2  diabetes mellitus without complication, with long-term current use of insulin ([place]) 03/17/2017  . Anxiety 03/17/2017  . HLD (hyperlipidemia) 03/17/2017  . Hot flashes 03/17/2017    Past Surgical History:  Procedure Laterality Date  . PLEURAL SCARIFICATION      Family History        Family Status  Relation Name Status  . Mother  Deceased  . Father  Deceased  . Sister  Alive  . Brother  Deceased  . MGM  (Not Specified)  . MGF  (Not Specified)  . PGM  (Not Specified)  . Neg Hx  (Not Specified)        Her family history includes Arthritis in her mother and paternal grandmother;  Asthma in her maternal grandmother; Dementia in her mother; Gastric cancer in her maternal grandfather; Glaucoma in her brother; Heart disease in her mother; Hyperlipidemia in her mother; Hypertension in her father, mother, and sister; Lung cancer in her father; Pancreatic cancer in her brother; Stomach cancer in her maternal grandfather. There is no history of Breast cancer or Colon cancer.      No Known Allergies   Current Outpatient Medications:  .  cetirizine (ZYRTEC) 10 MG tablet, Take 10 mg by mouth daily., Disp: , Rfl:  .  clonazePAM (KLONOPIN) 0.5 MG tablet, Take 1 tablet (0.5 mg total) by mouth 2 (two) times daily as needed., Disp: 30 tablet, Rfl: 0 .  Dulaglutide (TRULICITY) 2.99 ME/2.6ST SOPN, Inject 0.75 mg into the skin once a week., Disp: 4 pen, Rfl: 3 .  estradiol (CLIMARA) 0.025 mg/24hr patch, Place 1 patch (0.025 mg total) onto the skin once a week., Disp: 4 patch, Rfl: 11 .  gabapentin (NEURONTIN) 400 MG capsule, Take 1 capsule (400 mg total) by mouth at bedtime., Disp: 90 capsule, Rfl: 3 .  glucose blood (ONE TOUCH ULTRA TEST) test strip, Use 2 (two) times daily., Disp: , Rfl:  .  insulin lispro (HUMALOG) 100 UNIT/ML KiwkPen, Inject 0.05-0.07 mLs (5-7 Units total) into the skin daily with supper. (Patient taking differently: Inject 4-7 Units into the skin daily with supper. ), Disp: 15 mL, Rfl: 1 .  LANTUS SOLOSTAR 100 UNIT/ML Solostar Pen, Inject 14 Units into the skin daily at 10 pm., Disp: 15 mL, Rfl: 1 .  lisinopril (PRINIVIL,ZESTRIL) 10 MG tablet, Take 1 tablet (10 mg total) by mouth daily., Disp: 90 tablet, Rfl: 3 .  lovastatin (MEVACOR) 40 MG tablet, TAKE 1 TABLET (40 MG TOTAL) BY MOUTH AT BEDTIME., Disp: 30 tablet, Rfl: 3 .  metFORMIN (GLUCOPHAGE) 1000 MG tablet, Take 1 tablet (1,000 mg total) by mouth 2 (two) times daily with a meal., Disp: 180 tablet, Rfl: 1 .  Multiple Vitamin (MULTIVITAMIN) tablet, Take 1 tablet by mouth daily., Disp: , Rfl:  .  progesterone  (PROMETRIUM) 200 MG capsule, Take 1 capsule (200 mg total) by mouth daily. Take calendar days 1-12 each month, Disp: 12 capsule, Rfl: 11   Patient Care Team: Virginia Crews, MD as PCP - General (Family Medicine)      Objective:   Vitals: BP 108/72 (BP Location: Left Arm, Patient Position: Sitting, Cuff Size: Normal)   Pulse 82   Temp 97.9 F (36.6 C) (Oral)   Resp 16   Ht 5' 8" (1.727 m)   Wt 140 lb (63.5 kg)   LMP 12/02/2017   SpO2 97%   BMI 21.29 kg/m    Vitals:   12/08/17 1057  BP: 108/72  Pulse: 82  Resp: 16  Temp: 97.9 F (36.6 C)  TempSrc: Oral  SpO2: 97%  Weight: 140 lb (63.5 kg)  Height: 5' 8" (1.727 m)     Physical Exam  Constitutional: She is oriented to person, place, and time. She appears well-developed and well-nourished. No distress.  HENT:  Head: Normocephalic and atraumatic.  Right Ear: External ear normal.  Left Ear: External ear normal.  Nose: Nose normal.  Mouth/Throat: Oropharynx is clear and moist.  Eyes: Pupils are equal, round, and reactive to light. Conjunctivae and EOM are normal. No scleral icterus.  Neck: Neck supple. No thyromegaly present.  Cardiovascular: Normal rate, regular rhythm, normal heart sounds and intact distal pulses.  No murmur heard. Pulmonary/Chest: Effort normal and breath sounds normal. No respiratory distress. She has no wheezes. She has no rales.  Abdominal: Soft. Bowel sounds are normal. She exhibits no distension. There is no tenderness. There is no rebound and no guarding.  Musculoskeletal: She exhibits no edema or deformity.  Lymphadenopathy:    She has no cervical adenopathy.  Neurological: She is alert and oriented to person, place, and time.  Skin: Skin is warm and dry. Capillary refill takes less than 2 seconds. No rash noted.  Psychiatric: She has a normal mood and affect. Her behavior is normal.  Vitals reviewed.    Depression Screen PHQ 2/9 Scores 10/06/2017 03/17/2017  PHQ - 2 Score 0 0     Assessment & Plan:     Routine Health Maintenance and Physical Exam  Exercise Activities and Dietary recommendations Goals    None      Immunization History  Administered Date(s) Administered  . IPV 10/26/1973, 11/23/1973, 12/21/1973, 08/09/1991  . Influenza,inj,Quad PF,6+ Mos 03/17/2017  . Influenza-Unspecified 03/22/2016  . MMR 07/04/1985, 08/09/1991, 02/07/1992  . Pneumococcal Polysaccharide-23 04/02/2014  . Tdap 12/07/2011    Health Maintenance  Topic Date Due  . HIV Screening  09/10/1984  . INFLUENZA VACCINE  01/20/2018  . HEMOGLOBIN A1C  04/07/2018  . FOOT EXAM  10/07/2018  . OPHTHALMOLOGY EXAM  11/25/2018  . PNEUMOCOCCAL POLYSACCHARIDE VACCINE (2) 04/03/2019  . PAP SMEAR  10/16/2019  . TETANUS/TDAP  12/06/2021     Discussed health benefits of physical activity, and encouraged her to engage in regular exercise appropriate for her age and condition.    -------------------------------------------------------------------- Problem List Items Addressed This Visit      Other   HLD (hyperlipidemia)   Relevant Orders   Lipid panel   Comprehensive metabolic panel    Other Visit Diagnoses    Encounter for annual physical exam    -  Primary   Relevant Orders   Lipid panel   Comprehensive metabolic panel       Return in about 2 months (around 02/07/2018) for Diabetes f/u.   The entirety of the information documented in the History of Present Illness, Review of Systems and Physical Exam were personally obtained by me. Portions of this information were initially documented by Raquel Sarna Ratchford, CMA and reviewed by me for thoroughness and accuracy.    Virginia Crews, MD, MPH [facility] 12/08/2017 4:25 PM

## 2017-12-09 ENCOUNTER — Telehealth: Payer: Self-pay

## 2017-12-09 LAB — COMPREHENSIVE METABOLIC PANEL
ALBUMIN: 4.1 g/dL (ref 3.5–5.5)
ALT: 8 IU/L (ref 0–32)
AST: 12 IU/L (ref 0–40)
Albumin/Globulin Ratio: 1.2 (ref 1.2–2.2)
Alkaline Phosphatase: 64 IU/L (ref 39–117)
BUN / CREAT RATIO: 16 (ref 9–23)
BUN: 11 mg/dL (ref 6–24)
Bilirubin Total: 0.3 mg/dL (ref 0.0–1.2)
CALCIUM: 9.5 mg/dL (ref 8.7–10.2)
CO2: 25 mmol/L (ref 20–29)
CREATININE: 0.69 mg/dL (ref 0.57–1.00)
Chloride: 101 mmol/L (ref 96–106)
GFR calc non Af Amer: 103 mL/min/{1.73_m2} (ref >59)
GFR, EST AFRICAN AMERICAN: 119 mL/min/{1.73_m2} (ref >59)
GLUCOSE: 66 mg/dL (ref 65–99)
Globulin, Total: 3.3 g/dL (ref 1.5–4.5)
Potassium: 4.9 mmol/L (ref 3.5–5.2)
Sodium: 140 mmol/L (ref 134–144)
TOTAL PROTEIN: 7.4 g/dL (ref 6.0–8.5)

## 2017-12-09 LAB — LIPID PANEL
Chol/HDL Ratio: 2.9 ratio (ref 0.0–4.4)
Cholesterol, Total: 150 mg/dL (ref 100–199)
HDL: 51 mg/dL (ref >39)
LDL Calculated: 92 mg/dL (ref 0–99)
Triglycerides: 35 mg/dL (ref 0–149)
VLDL Cholesterol Cal: 7 mg/dL (ref 5–40)

## 2017-12-09 NOTE — Telephone Encounter (Signed)
-----   Message from Virginia Crews, MD sent at 12/09/2017  8:13 AM EDT ----- Normal cholesterol and kidney function, liver function, electrolytes.  Virginia Crews, MD, MPH [facility] 12/09/2017 8:13 AM

## 2017-12-09 NOTE — Telephone Encounter (Signed)
Lmtcb. Also asked pt to view labs on MyChart.

## 2017-12-13 NOTE — Telephone Encounter (Signed)
Left message advising pt.  (Per DPR)  Thanks,   -Laura  

## 2017-12-30 ENCOUNTER — Telehealth: Payer: Self-pay

## 2017-12-30 NOTE — Telephone Encounter (Signed)
Pt requesting to be switched from hormone patch to Estrogen pills. She has tried the patches from 2-3 different manufacturers & she seems to be allergic. She has itching & rash with the patch. [phone]

## 2017-12-30 NOTE — Telephone Encounter (Signed)
Spoke w/pt. Notified SDJ out of office today & will return tomorrow. Verified pharamcy as Building control surveyor.

## 2018-01-03 ENCOUNTER — Other Ambulatory Visit: Payer: Self-pay | Admitting: Obstetrics and Gynecology

## 2018-01-03 DIAGNOSIS — R232 Flushing: Secondary | ICD-10-CM

## 2018-01-03 MED ORDER — ESTRADIOL 1 MG PO TABS: 1.0000 mg | ORAL_TABLET | Freq: Every day | ORAL | 11 refills | Status: DC

## 2018-01-03 NOTE — Telephone Encounter (Signed)
Called in estradiol 1 mg tablets. Patient to stop taking patch. Thanks.

## 2018-01-04 NOTE — Telephone Encounter (Signed)
LMVM to notify pt rx sent in.

## 2018-02-09 ENCOUNTER — Encounter: Payer: Self-pay | Admitting: Family Medicine

## 2018-02-09 ENCOUNTER — Other Ambulatory Visit: Payer: Self-pay | Admitting: Family Medicine

## 2018-02-09 ENCOUNTER — Ambulatory Visit: Payer: 59 | Admitting: Family Medicine

## 2018-02-09 VITALS — BP 112/80 | HR 83 | Temp 97.8°F | Resp 16 | Wt 143.0 lb

## 2018-02-09 DIAGNOSIS — E119 Type 2 diabetes mellitus without complications: Secondary | ICD-10-CM

## 2018-02-09 DIAGNOSIS — Z794 Long term (current) use of insulin: Secondary | ICD-10-CM

## 2018-02-09 LAB — POCT GLYCOSYLATED HEMOGLOBIN (HGB A1C)
ESTIMATED AVERAGE GLUCOSE: 143
HEMOGLOBIN A1C: 6.6 % — AB (ref 4.0–5.6)

## 2018-02-09 NOTE — Patient Instructions (Addendum)
Stop Humalog Continue Lantus at current dose Continue Trulicity at current dose

## 2018-02-09 NOTE — Progress Notes (Signed)
Patient: Lynn Coleman Female    DOB: [birth year]   47 y.o.   MRN: [id number] Visit Date: 02/09/2018  Today's Provider: Lavon Paganini, MD   I, Martha Clan, CMA, am acting as scribe for Lavon Paganini, MD.  Chief Complaint  Patient presents with  . Diabetes   Subjective:    HPI      Diabetes Mellitus Type II, Follow-up:   Lab Results  Component Value Date   HGBA1C 7.5 10/06/2017   HGBA1C 8.4 (H) 03/17/2017    Last seen for diabetes 4 months ago.  Management since then includes adding Trulicity. She reports good compliance with treatment. She is not having side effects.  Current symptoms include none and have been stable. Home blood sugar records: not being checked  Episodes of hypoglycemia? yes - "occasioanlly" 3-4 times per month in the mornings. Unchanged since starting Trulicity.    Current Insulin Regimen: Humalog sliding scale with supper (5-7 units), and Lantus 14 units at bedtime. Most Recent Eye Exam: 11/24/2017- negative Weight trend: fluctuating a bit Current diet: in general, a "healthy" diet  "excpet on vacations." Current exercise: has decreased exercising because her recertification exam is due in October, and she is concentrating on studying for this  Pertinent Labs:    Component Value Date/Time   CHOL 150 12/08/2017 1210   TRIG 35 12/08/2017 1210   HDL 51 12/08/2017 1210   LDLCALC 92 12/08/2017 1210   CREATININE 0.69 12/08/2017 1210    Wt Readings from Last 3 Encounters:  02/09/18 143 lb (64.9 kg)  12/08/17 140 lb (63.5 kg)  10/06/17 145 lb (65.8 kg)    ------------------------------------------------------------------------   No Known Allergies   Current Outpatient Medications:  .  cetirizine (ZYRTEC) 10 MG tablet, Take 10 mg by mouth daily., Disp: , Rfl:  .  cholecalciferol (VITAMIN D) 400 units TABS tablet, Take 400 Units by mouth., Disp: , Rfl:  .  clonazePAM (KLONOPIN) 0.5 MG tablet, Take 1 tablet (0.5 mg  total) by mouth 2 (two) times daily as needed., Disp: 30 tablet, Rfl: 1 .  estradiol (ESTRACE) 1 MG tablet, Take 1 tablet (1 mg total) by mouth daily., Disp: 30 tablet, Rfl: 11 .  gabapentin (NEURONTIN) 400 MG capsule, Take 1 capsule (400 mg total) by mouth at bedtime., Disp: 90 capsule, Rfl: 3 .  glucose blood (ONE TOUCH ULTRA TEST) test strip, Use 2 (two) times daily., Disp: , Rfl:  .  insulin lispro (HUMALOG) 100 UNIT/ML KiwkPen, Inject 0.05-0.07 mLs (5-7 Units total) into the skin daily with supper. (Patient taking differently: Inject 4-7 Units into the skin daily with supper. ), Disp: 15 mL, Rfl: 1 .  LANTUS SOLOSTAR 100 UNIT/ML Solostar Pen, Inject 14 Units into the skin daily at 10 pm., Disp: 15 mL, Rfl: 1 .  lisinopril (PRINIVIL,ZESTRIL) 10 MG tablet, Take 1 tablet (10 mg total) by mouth daily., Disp: 90 tablet, Rfl: 3 .  lovastatin (MEVACOR) 40 MG tablet, TAKE 1 TABLET (40 MG TOTAL) BY MOUTH AT BEDTIME., Disp: 30 tablet, Rfl: 3 .  metFORMIN (GLUCOPHAGE) 1000 MG tablet, Take 1 tablet (1,000 mg total) by mouth 2 (two) times daily with a meal., Disp: 180 tablet, Rfl: 1 .  Multiple Vitamin (MULTIVITAMIN) tablet, Take 1 tablet by mouth daily., Disp: , Rfl:  .  progesterone (PROMETRIUM) 200 MG capsule, Take 1 capsule (200 mg total) by mouth daily. Take calendar days 1-12 each month, Disp: 12 capsule, Rfl: 11 .  TRULICITY 9.20  MG/0.5ML SOPN, INJECT 0.75 MG INTO THE SKIN ONCE A WEEK., Disp: 2 mL, Rfl: 3  Review of Systems  Constitutional: Positive for activity change (exercising less), appetite change (eating more) and diaphoresis (night sweats). Negative for chills, fatigue, fever and unexpected weight change.  Eyes: Negative.  Negative for visual disturbance.  Respiratory: Negative.  Negative for shortness of breath.   Cardiovascular: Negative for chest pain, palpitations and leg swelling.  Gastrointestinal: Negative.   Endocrine: Negative for polydipsia, polyphagia and polyuria.    Genitourinary: Negative.   Musculoskeletal: Negative.   Neurological: Negative.   Psychiatric/Behavioral: Negative.     Social History   Tobacco Use  . Smoking status: Never Smoker  . Smokeless tobacco: Never Used  Substance Use Topics  . Alcohol use: Yes    Comment: occasional. 1 glass of wine 2-3 times per month   Objective:   BP 112/80 (BP Location: Left Arm, Patient Position: Sitting, Cuff Size: Normal)   Pulse 83   Temp 97.8 F (36.6 C) (Oral)   Resp 16   Wt 143 lb (64.9 kg)   SpO2 97%   BMI 21.74 kg/m  Vitals:   02/09/18 1457  BP: 112/80  Pulse: 83  Resp: 16  Temp: 97.8 F (36.6 C)  TempSrc: Oral  SpO2: 97%  Weight: 143 lb (64.9 kg)     Physical Exam  Constitutional: She is oriented to person, place, and time. She appears well-developed and well-nourished. No distress.  HENT:  Head: Normocephalic and atraumatic.  Eyes: Conjunctivae are normal. No scleral icterus.  Cardiovascular: Normal rate, regular rhythm, normal heart sounds and intact distal pulses.  No murmur heard. Pulmonary/Chest: Effort normal and breath sounds normal. No respiratory distress. She has no wheezes.  Musculoskeletal: She exhibits no edema.  Neurological: She is alert and oriented to person, place, and time.  Skin: Skin is warm and dry. Capillary refill takes less than 2 seconds. No rash noted.  Psychiatric: She has a normal mood and affect. Her behavior is normal.  Vitals reviewed.   Results for orders placed or performed in visit on 02/09/18  POCT glycosylated hemoglobin (Hb A1C)  Result Value Ref Range   Hemoglobin A1C 6.6 (A) 4.0 - 5.6 %   Est. average glucose Bld gHb Est-mCnc 143        Assessment & Plan:   Problem List Items Addressed This Visit      Endocrine   Type 2 diabetes mellitus without complication, with long-term current use of insulin ([place]) - Primary    Well controlled A1c improved from 7.5 to 6.6 UTD on foot exam, eye exam On Lisinopril UTD on  vaccines Continue metformin, trulicity and lantus D/c Humalog as A1c is improving and she is having some hypoglycemia Consider further dose increase of trulicity at follow-up to try to get off of insulin F/u in 3 months and recheck A1c      Relevant Orders   POCT glycosylated hemoglobin (Hb A1C) (Completed)       Return in about 3 months (around 05/12/2018) for Diabetes f/u.   The entirety of the information documented in the History of Present Illness, Review of Systems and Physical Exam were personally obtained by me. Portions of this information were initially documented by Raquel Sarna Ratchford, CMA and reviewed by me for thoroughness and accuracy.    Virginia Crews, MD, MPH [facility] 02/09/2018 3:44 PM

## 2018-02-09 NOTE — Assessment & Plan Note (Signed)
Well controlled A1c improved from 7.5 to 6.6 UTD on foot exam, eye exam On Lisinopril UTD on vaccines Continue metformin, trulicity and lantus D/c Humalog as A1c is improving and she is having some hypoglycemia Consider further dose increase of trulicity at follow-up to try to get off of insulin F/u in 3 months and recheck A1c

## 2018-03-02 ENCOUNTER — Other Ambulatory Visit: Payer: Self-pay | Admitting: Family Medicine

## 2018-03-02 MED ORDER — METFORMIN HCL 1000 MG PO TABS: 1000.0000 mg | ORAL_TABLET | Freq: Two times a day (BID) | ORAL | 1 refills | Status: DC

## 2018-03-02 NOTE — Telephone Encounter (Signed)
Pt contacted office for refill request on the following medications:  metFORMIN (GLUCOPHAGE) 1000 MG tablet  90 day supply  [place] Rx was sent by [facility]  Please advise. Thanks TNP

## 2018-04-27 ENCOUNTER — Other Ambulatory Visit: Payer: Self-pay | Admitting: Family Medicine

## 2018-04-27 DIAGNOSIS — Z1231 Encounter for screening mammogram for malignant neoplasm of breast: Secondary | ICD-10-CM

## 2018-05-04 ENCOUNTER — Other Ambulatory Visit: Payer: Self-pay | Admitting: Family Medicine

## 2018-05-04 ENCOUNTER — Ambulatory Visit
Admission: RE | Admit: 2018-05-04 | Discharge: 2018-05-04 | Disposition: A | Discharge status: Home / Self Care | Payer: 59 | Source: Ambulatory Visit | Attending: Family Medicine | Admitting: Family Medicine

## 2018-05-04 DIAGNOSIS — Z1231 Encounter for screening mammogram for malignant neoplasm of breast: Secondary | ICD-10-CM | POA: Insufficient documentation

## 2018-05-25 ENCOUNTER — Ambulatory Visit: Payer: 59 | Admitting: Family Medicine

## 2018-05-25 ENCOUNTER — Encounter: Payer: Self-pay | Admitting: Family Medicine

## 2018-05-25 VITALS — BP 116/77 | HR 93 | Temp 97.8°F | Wt 142.6 lb

## 2018-05-25 DIAGNOSIS — Z794 Long term (current) use of insulin: Secondary | ICD-10-CM

## 2018-05-25 DIAGNOSIS — E119 Type 2 diabetes mellitus without complications: Secondary | ICD-10-CM | POA: Diagnosis not present

## 2018-05-25 LAB — POCT GLYCOSYLATED HEMOGLOBIN (HGB A1C): Hemoglobin A1C: 6.4 % — AB (ref 4.0–5.6)

## 2018-05-25 MED ORDER — LANTUS SOLOSTAR 100 UNIT/ML ~~LOC~~ SOPN: 10.0000 [IU] | PEN_INJECTOR | Freq: Every day | SUBCUTANEOUS | 1 refills | Status: DC

## 2018-05-25 NOTE — Assessment & Plan Note (Signed)
Well-controlled A1c has improved again from 6.6 down to 6.4 Up-to-date on foot exam and eye exam On lisinopril Up-to-date on vaccines Continue metformin and Trulicity at current doses Patient is hesitant to increase Trulicity and decrease insulin We have already discontinued Humalog Decrease Lantus to 10 units from 14 units nightly Follow-up in 3 months with repeat A1c

## 2018-05-25 NOTE — Progress Notes (Signed)
Patient: Lynn Coleman Female    DOB: [birth year]   47 y.o.   MRN: [id number] Visit Date: 05/25/2018  Today's Provider: Lavon Paganini, MD   Chief Complaint  Patient presents with  . Diabetes   Subjective:    HPI    Diabetes Mellitus Type II, Follow-up:   Lab Results  Component Value Date   HGBA1C 6.6 (A) 02/09/2018   HGBA1C 7.5 10/06/2017   HGBA1C 8.4 (H) 03/17/2017    Last seen for diabetes 3 months ago.  Management since then includes continue Metformin, Trulicity, and Lantus She reports good compliance with treatment. She is not having side effects.  Current symptoms include none  Home blood sugar records: patient does not check FBS  Episodes of hypoglycemia? Occasionally    Current Insulin Regimen: Lantus Most Recent Eye Exam: 11/24/2017 Weight trend: stable Prior visit with dietician: yes  Current diet: well balanced Current exercise: cardiovascular workout on exercise equipment  Pertinent Labs:    Component Value Date/Time   CHOL 150 12/08/2017 1210   TRIG 35 12/08/2017 1210   HDL 51 12/08/2017 1210   LDLCALC 92 12/08/2017 1210   CREATININE 0.69 12/08/2017 1210    Wt Readings from Last 3 Encounters:  05/25/18 142 lb 9.6 oz (64.7 kg)  02/09/18 143 lb (64.9 kg)  12/08/17 140 lb (63.5 kg)    She is hesitant to go to higher dose of Trulicity as she does have some mild nausea and decreased appetite. ------------------------------------------------------------------------     No Known Allergies   Current Outpatient Medications:  .  cetirizine (ZYRTEC) 10 MG tablet, Take 10 mg by mouth daily., Disp: , Rfl:  .  cholecalciferol (VITAMIN D) 400 units TABS tablet, Take 400 Units by mouth., Disp: , Rfl:  .  clonazePAM (KLONOPIN) 0.5 MG tablet, Take 1 tablet (0.5 mg total) by mouth 2 (two) times daily as needed., Disp: 30 tablet, Rfl: 1 .  estradiol (ESTRACE) 1 MG tablet, Take 1 tablet (1 mg total) by mouth daily., Disp: 30 tablet, Rfl: 11 .   gabapentin (NEURONTIN) 400 MG capsule, Take 1 capsule (400 mg total) by mouth at bedtime., Disp: 90 capsule, Rfl: 3 .  glucose blood (ONE TOUCH ULTRA TEST) test strip, Use 2 (two) times daily., Disp: , Rfl:  .  LANTUS SOLOSTAR 100 UNIT/ML Solostar Pen, Inject 14 Units into the skin daily at 10 pm., Disp: 15 mL, Rfl: 1 .  lisinopril (PRINIVIL,ZESTRIL) 10 MG tablet, Take 1 tablet (10 mg total) by mouth daily., Disp: 90 tablet, Rfl: 3 .  lovastatin (MEVACOR) 40 MG tablet, TAKE 1 TABLET (40 MG TOTAL) BY MOUTH AT BEDTIME., Disp: 90 tablet, Rfl: 3 .  metFORMIN (GLUCOPHAGE) 1000 MG tablet, Take 1 tablet (1,000 mg total) by mouth 2 (two) times daily with a meal., Disp: 180 tablet, Rfl: 1 .  Multiple Vitamin (MULTIVITAMIN) tablet, Take 1 tablet by mouth daily., Disp: , Rfl:  .  progesterone (PROMETRIUM) 200 MG capsule, Take 1 capsule (200 mg total) by mouth daily. Take calendar days 1-12 each month, Disp: 12 capsule, Rfl: 11 .  TRULICITY 2.70 WC/3.7SE SOPN, INJECT 0.75 MG INTO THE SKIN ONCE A WEEK., Disp: 2 mL, Rfl: 3  Review of Systems  Constitutional: Negative.   HENT: Negative.   Respiratory: Negative.   Cardiovascular: Negative.   Gastrointestinal: Negative.   Musculoskeletal: Negative.   Neurological: Negative.   Psychiatric/Behavioral: Negative.     Social History   Tobacco Use  . Smoking status: Never  Smoker  . Smokeless tobacco: Never Used  Substance Use Topics  . Alcohol use: Yes    Comment: occasional. 1 glass of wine 2-3 times per month   Objective:   BP 116/77 (BP Location: Right Arm, Patient Position: Sitting, Cuff Size: Normal)   Pulse 93   Temp 97.8 F (36.6 C) (Oral)   Wt 142 lb 9.6 oz (64.7 kg)   SpO2 99%   BMI 21.68 kg/m  Vitals:   05/25/18 1447  BP: 116/77  Pulse: 93  Temp: 97.8 F (36.6 C)  TempSrc: Oral  SpO2: 99%  Weight: 142 lb 9.6 oz (64.7 kg)     Physical Exam  Constitutional: She is oriented to person, place, and time. She appears well-developed  and well-nourished. No distress.  HENT:  Head: Normocephalic and atraumatic.  Mouth/Throat: Oropharynx is clear and moist.  Eyes: Conjunctivae are normal. No scleral icterus.  Neck: Neck supple. No thyromegaly present.  Cardiovascular: Normal rate, regular rhythm, normal heart sounds and intact distal pulses.  No murmur heard. Pulmonary/Chest: Effort normal and breath sounds normal. No respiratory distress. She has no wheezes. She has no rales.  Abdominal: Soft. She exhibits no distension. There is no tenderness.  Musculoskeletal: She exhibits no edema.  Lymphadenopathy:    She has no cervical adenopathy.  Neurological: She is alert and oriented to person, place, and time.  Skin: Skin is warm and dry. Capillary refill takes less than 2 seconds. No rash noted.  Psychiatric: She has a normal mood and affect. Her behavior is normal.  Vitals reviewed.    Results for orders placed or performed in visit on 05/25/18  POCT HgB A1C  Result Value Ref Range   Hemoglobin A1C 6.4 (A) 4.0 - 5.6 %   HbA1c POC (<> result, manual entry)     HbA1c, POC (prediabetic range)     HbA1c, POC (controlled diabetic range)         Assessment & Plan:   Problem List Items Addressed This Visit      Endocrine   Type 2 diabetes mellitus without complication, with long-term current use of insulin ([facility]CC) - Primary    Well-controlled A1c has improved again from 6.6 down to 6.4 Up-to-date on foot exam and eye exam On lisinopril Up-to-date on vaccines Continue metformin and Trulicity at current doses Patient is hesitant to increase Trulicity and decrease insulin We have already discontinued Humalog Decrease Lantus to 10 units from 14 units nightly Follow-up in 3 months with repeat A1c      Relevant Medications   LANTUS SOLOSTAR 100 UNIT/ML Solostar Pen   Other Relevant Orders   POCT HgB A1C (Completed)       Return in about 3 months (around 08/24/2018) for Diabetes f/u.   The entirety of the  information documented in the History of Present Illness, Review of Systems and Physical Exam were personally obtained by me. Portions of this information were initially documented by Tiburcio Pea, CMA and reviewed by me for thoroughness and accuracy.    Virginia Crews, MD, MPH [facility] 05/25/2018 3:09 PM

## 2018-05-25 NOTE — Patient Instructions (Addendum)
Decrease Lantus to 10 units   Diabetes Mellitus and Nutrition When you have diabetes (diabetes mellitus), it is very important to have healthy eating habits because your blood sugar (glucose) levels are greatly affected by what you eat and drink. Eating healthy foods in the appropriate amounts, at about the same times every day, can help you:  Control your blood glucose.  Lower your risk of heart disease.  Improve your blood pressure.  Reach or maintain a healthy weight.  Every person with diabetes is different, and each person has different needs for a meal plan. Your health care provider may recommend that you work with a diet and nutrition specialist (dietitian) to make a meal plan that is best for you. Your meal plan may vary depending on factors such as:  The calories you need.  The medicines you take.  Your weight.  Your blood glucose, blood pressure, and cholesterol levels.  Your activity level.  Other health conditions you have, such as heart or kidney disease.  How do carbohydrates affect me? Carbohydrates affect your blood glucose level more than any other type of food. Eating carbohydrates naturally increases the amount of glucose in your blood. Carbohydrate counting is a method for keeping track of how many carbohydrates you eat. Counting carbohydrates is important to keep your blood glucose at a healthy level, especially if you use insulin or take certain oral diabetes medicines. It is important to know how many carbohydrates you can safely have in each meal. This is different for every person. Your dietitian can help you calculate how many carbohydrates you should have at each meal and for snack. Foods that contain carbohydrates include:  Bread, cereal, rice, pasta, and crackers.  Potatoes and corn.  Peas, beans, and lentils.  Milk and yogurt.  Fruit and juice.  Desserts, such as cakes, cookies, ice cream, and candy.  How does alcohol affect me? Alcohol can  cause a sudden decrease in blood glucose (hypoglycemia), especially if you use insulin or take certain oral diabetes medicines. Hypoglycemia can be a life-threatening condition. Symptoms of hypoglycemia (sleepiness, dizziness, and confusion) are similar to symptoms of having too much alcohol. If your health care provider says that alcohol is safe for you, follow these guidelines:  Limit alcohol intake to no more than 1 drink per day for nonpregnant women and 2 drinks per day for men. One drink equals 12 oz of beer, 5 oz of wine, or 1 oz of hard liquor.  Do not drink on an empty stomach.  Keep yourself hydrated with water, diet soda, or unsweetened iced tea.  Keep in mind that regular soda, juice, and other mixers may contain a lot of sugar and must be counted as carbohydrates.  What are tips for following this plan? Reading food labels  Start by checking the serving size on the label. The amount of calories, carbohydrates, fats, and other nutrients listed on the label are based on one serving of the food. Many foods contain more than one serving per package.  Check the total grams (g) of carbohydrates in one serving. You can calculate the number of servings of carbohydrates in one serving by dividing the total carbohydrates by 15. For example, if a food has 30 g of total carbohydrates, it would be equal to 2 servings of carbohydrates.  Check the number of grams (g) of saturated and trans fats in one serving. Choose foods that have low or no amount of these fats.  Check the number of milligrams (mg)  of sodium in one serving. Most people should limit total sodium intake to less than 2,300 mg per day.  Always check the nutrition information of foods labeled as "low-fat" or "nonfat". These foods may be higher in added sugar or refined carbohydrates and should be avoided.  Talk to your dietitian to identify your daily goals for nutrients listed on the label. Shopping  Avoid buying canned,  premade, or processed foods. These foods tend to be high in fat, sodium, and added sugar.  Shop around the outside edge of the grocery store. This includes fresh fruits and vegetables, bulk grains, fresh meats, and fresh dairy. Cooking  Use low-heat cooking methods, such as baking, instead of high-heat cooking methods like deep frying.  Cook using healthy oils, such as olive, canola, or sunflower oil.  Avoid cooking with butter, cream, or high-fat meats. Meal planning  Eat meals and snacks regularly, preferably at the same times every day. Avoid going long periods of time without eating.  Eat foods high in fiber, such as fresh fruits, vegetables, beans, and whole grains. Talk to your dietitian about how many servings of carbohydrates you can eat at each meal.  Eat 4-6 ounces of lean protein each day, such as lean meat, chicken, fish, eggs, or tofu. 1 ounce is equal to 1 ounce of meat, chicken, or fish, 1 egg, or 1/4 cup of tofu.  Eat some foods each day that contain healthy fats, such as avocado, nuts, seeds, and fish. Lifestyle   Check your blood glucose regularly.  Exercise at least 30 minutes 5 or more days each week, or as told by your health care provider.  Take medicines as told by your health care provider.  Do not use any products that contain nicotine or tobacco, such as cigarettes and e-cigarettes. If you need help quitting, ask your health care provider.  Work with a Social worker or diabetes educator to identify strategies to manage stress and any emotional and social challenges. What are some questions to ask my health care provider?  Do I need to meet with a diabetes educator?  Do I need to meet with a dietitian?  What number can I call if I have questions?  When are the best times to check my blood glucose? Where to find more information:  American Diabetes Association: diabetes.org/food-and-fitness/food  Academy of Nutrition and Dietetics:  PokerClues.dk  [employer] of Diabetes and Digestive and Kidney Diseases (NIH): ContactWire.be Summary  A healthy meal plan will help you control your blood glucose and maintain a healthy lifestyle.  Working with a diet and nutrition specialist (dietitian) can help you make a meal plan that is best for you.  Keep in mind that carbohydrates and alcohol have immediate effects on your blood glucose levels. It is important to count carbohydrates and to use alcohol carefully. This information is not intended to replace advice given to you by your health care provider. Make sure you discuss any questions you have with your health care provider. Document Released: 03/05/2005 Document Revised: 07/13/2016 Document Reviewed: 07/13/2016 Elsevier Interactive Patient Education  [employer].

## 2018-06-01 ENCOUNTER — Other Ambulatory Visit: Payer: Self-pay | Admitting: Family Medicine

## 2018-08-31 ENCOUNTER — Encounter: Payer: Self-pay | Admitting: Family Medicine

## 2018-08-31 ENCOUNTER — Ambulatory Visit: Payer: 59 | Admitting: Family Medicine

## 2018-08-31 VITALS — BP 124/84 | HR 86 | Temp 98.2°F | Wt 140.0 lb

## 2018-08-31 DIAGNOSIS — R232 Flushing: Secondary | ICD-10-CM

## 2018-08-31 DIAGNOSIS — Z794 Long term (current) use of insulin: Secondary | ICD-10-CM

## 2018-08-31 DIAGNOSIS — E7800 Pure hypercholesterolemia, unspecified: Secondary | ICD-10-CM

## 2018-08-31 DIAGNOSIS — E119 Type 2 diabetes mellitus without complications: Secondary | ICD-10-CM | POA: Diagnosis not present

## 2018-08-31 DIAGNOSIS — F419 Anxiety disorder, unspecified: Secondary | ICD-10-CM | POA: Diagnosis not present

## 2018-08-31 LAB — POCT GLYCOSYLATED HEMOGLOBIN (HGB A1C)
Est. average glucose Bld gHb Est-mCnc: 146
Hemoglobin A1C: 6.7 % — AB (ref 4.0–5.6)

## 2018-08-31 MED ORDER — LANTUS SOLOSTAR 100 UNIT/ML ~~LOC~~ SOPN: 5.0000 [IU] | PEN_INJECTOR | Freq: Every day | SUBCUTANEOUS | 1 refills | Status: DC

## 2018-08-31 MED ORDER — CLONAZEPAM 0.5 MG PO TABS: 0.5000 mg | ORAL_TABLET | Freq: Two times a day (BID) | ORAL | 1 refills | Status: DC | PRN

## 2018-08-31 MED ORDER — PROGESTERONE MICRONIZED 200 MG PO CAPS: 200.0000 mg | ORAL_CAPSULE | Freq: Every day | ORAL | 11 refills | Status: DC

## 2018-08-31 MED ORDER — LISINOPRIL 10 MG PO TABS: 10.0000 mg | ORAL_TABLET | Freq: Every day | ORAL | 3 refills | Status: DC

## 2018-08-31 MED ORDER — DULAGLUTIDE 1.5 MG/0.5ML ~~LOC~~ SOAJ: 1.5000 mg | SUBCUTANEOUS | 5 refills | Status: DC

## 2018-08-31 MED ORDER — LOVASTATIN 40 MG PO TABS: 40.0000 mg | ORAL_TABLET | Freq: Every day | ORAL | 3 refills | Status: DC

## 2018-08-31 MED ORDER — GABAPENTIN 400 MG PO CAPS: 400.0000 mg | ORAL_CAPSULE | Freq: Every day | ORAL | 3 refills | Status: DC

## 2018-08-31 MED ORDER — METFORMIN HCL 1000 MG PO TABS: 1000.0000 mg | ORAL_TABLET | Freq: Two times a day (BID) | ORAL | 3 refills | Status: DC

## 2018-08-31 NOTE — Progress Notes (Signed)
Patient: Lynn Coleman Female    DOB: 1971/06/01   48 y.o.   MRN: [id number] Visit Date: 08/31/2018  Today's Provider: Lavon Paganini, MD   Chief Complaint  Patient presents with  . Diabetes   Subjective:    HPI  Diabetes Mellitus Type II, Follow-up:   Lab Results  Component Value Date   HGBA1C 6.4 (A) 05/25/2018   HGBA1C 6.6 (A) 02/09/2018   HGBA1C 7.5 10/06/2017    Last seen for diabetes 3 months ago.  Management since then includes continue Metformin and Trulicity. Decrease Lantus from 14 units to 10 units nightly. She reports good compliance with treatment. She is not having side effects.  Current symptoms include none and have been stable. Home blood sugar records: N/A  Episodes of hypoglycemia? no   Current Insulin Regimen:Lantus Most Recent Eye Exam: 11/24/2017 Weight trend: stable Prior visit with dietician: No Current exercise: cardiovascular workout on exercise equipment Current diet habits: well balanced   Pertinent Labs:    Component Value Date/Time   CHOL 150 12/08/2017 1210   TRIG 35 12/08/2017 1210   HDL 51 12/08/2017 1210   LDLCALC 92 12/08/2017 1210   CREATININE 0.69 12/08/2017 1210    Wt Readings from Last 3 Encounters:  08/31/18 140 lb (63.5 kg)  05/25/18 142 lb 9.6 oz (64.7 kg)  02/09/18 143 lb (64.9 kg)    ------------------------------------------------------------------------   No Known Allergies   Current Outpatient Medications:  .  cetirizine (ZYRTEC) 10 MG tablet, Take 10 mg by mouth daily., Disp: , Rfl:  .  cholecalciferol (VITAMIN D) 400 units TABS tablet, Take 400 Units by mouth., Disp: , Rfl:  .  clonazePAM (KLONOPIN) 0.5 MG tablet, Take 1 tablet (0.5 mg total) by mouth 2 (two) times daily as needed., Disp: 30 tablet, Rfl: 1 .  estradiol (ESTRACE) 1 MG tablet, Take 1 tablet (1 mg total) by mouth daily., Disp: 30 tablet, Rfl: 11 .  gabapentin (NEURONTIN) 400 MG capsule, Take 1 capsule (400 mg total) by mouth at  bedtime., Disp: 90 capsule, Rfl: 3 .  glucose blood (ONE TOUCH ULTRA TEST) test strip, Use 2 (two) times daily., Disp: , Rfl:  .  LANTUS SOLOSTAR 100 UNIT/ML Solostar Pen, Inject 10 Units into the skin daily at 10 pm., Disp: 15 mL, Rfl: 1 .  lisinopril (PRINIVIL,ZESTRIL) 10 MG tablet, Take 1 tablet (10 mg total) by mouth daily., Disp: 90 tablet, Rfl: 3 .  lovastatin (MEVACOR) 40 MG tablet, TAKE 1 TABLET (40 MG TOTAL) BY MOUTH AT BEDTIME., Disp: 90 tablet, Rfl: 3 .  metFORMIN (GLUCOPHAGE) 1000 MG tablet, Take 1 tablet (1,000 mg total) by mouth 2 (two) times daily with a meal., Disp: 180 tablet, Rfl: 1 .  Multiple Vitamin (MULTIVITAMIN) tablet, Take 1 tablet by mouth daily., Disp: , Rfl:  .  progesterone (PROMETRIUM) 200 MG capsule, Take 1 capsule (200 mg total) by mouth daily. Take calendar days 1-12 each month, Disp: 12 capsule, Rfl: 11 .  TRULICITY 1.15 BW/6.2MB SOPN, INJECT 0.75 MG INTO THE SKIN ONCE A WEEK., Disp: 6 mL, Rfl: 3  Review of Systems  HENT: Negative.   Respiratory: Negative.   Genitourinary: Negative.   Hematological: Negative.     Social History   Tobacco Use  . Smoking status: Never Smoker  . Smokeless tobacco: Never Used  Substance Use Topics  . Alcohol use: Yes    Comment: occasional. 1 glass of wine 2-3 times per month  Objective:   BP 124/84 (BP Location: Left Arm, Patient Position: Sitting, Cuff Size: Normal)   Pulse 86   Temp 98.2 F (36.8 C) (Oral)   Wt 140 lb (63.5 kg)   SpO2 98%   BMI 21.29 kg/m  Vitals:   08/31/18 1558  BP: 124/84  Pulse: 86  Temp: 98.2 F (36.8 C)  TempSrc: Oral  SpO2: 98%  Weight: 140 lb (63.5 kg)     Physical Exam Vitals signs reviewed.  Constitutional:      General: She is not in acute distress.    Appearance: Normal appearance. She is well-developed. She is not diaphoretic.  HENT:     Head: Normocephalic and atraumatic.  Eyes:     General: No scleral icterus.    Conjunctiva/sclera: Conjunctivae normal.   Neck:     Musculoskeletal: Neck supple.     Thyroid: No thyromegaly.  Cardiovascular:     Rate and Rhythm: Normal rate and regular rhythm.     Pulses: Normal pulses.     Heart sounds: Normal heart sounds. No murmur.  Pulmonary:     Effort: Pulmonary effort is normal. No respiratory distress.     Breath sounds: Normal breath sounds. No wheezing, rhonchi or rales.  Musculoskeletal:     Right lower leg: No edema.     Left lower leg: No edema.  Lymphadenopathy:     Cervical: No cervical adenopathy.  Skin:    General: Skin is warm and dry.     Capillary Refill: Capillary refill takes less than 2 seconds.     Findings: No rash.  Neurological:     Mental Status: She is alert and oriented to person, place, and time. Mental status is at baseline.  Psychiatric:        Mood and Affect: Mood normal.        Behavior: Behavior normal.    Diabetic Foot Exam - Simple   Simple Foot Form Diabetic Foot exam was performed with the following findings:  Yes 08/31/2018  4:43 PM  Visual Inspection No deformities, no ulcerations, no other skin breakdown bilaterally:  Yes Sensation Testing Intact to touch and monofilament testing bilaterally:  Yes Pulse Check Posterior Tibialis and Dorsalis pulse intact bilaterally:  Yes Comments     Results for orders placed or performed in visit on 08/31/18  POCT glycosylated hemoglobin (Hb A1C)  Result Value Ref Range   Hemoglobin A1C 6.7 (A) 4.0 - 5.6 %   HbA1c POC (<> result, manual entry)     HbA1c, POC (prediabetic range)     HbA1c, POC (controlled diabetic range)     Est. average glucose Bld gHb Est-mCnc 146         Assessment & Plan   Problem List Items Addressed This Visit      Cardiovascular and Mediastinum   Hot flashes    Continue gabapentin      Relevant Medications   lisinopril (PRINIVIL,ZESTRIL) 10 MG tablet   lovastatin (MEVACOR) 40 MG tablet   progesterone (PROMETRIUM) 200 MG capsule     Endocrine   Type 2 diabetes mellitus  without complication, with long-term current use of insulin ([place]) - Primary    Well controlled Trying to d/c Lantus Decrease Lantus to 5 units Increase Trulicity to 9.5KD Continue metformin Ok to stop lantus if BG well controlled F/u in 3 months and recheck A1c      Relevant Medications   LANTUS SOLOSTAR 100 UNIT/ML Solostar Pen   lisinopril (PRINIVIL,ZESTRIL) 10 MG tablet  lovastatin (MEVACOR) 40 MG tablet   metFORMIN (GLUCOPHAGE) 1000 MG tablet   Dulaglutide (TRULICITY) 1.5 XB/2.6OM SOPN   Other Relevant Orders   POCT glycosylated hemoglobin (Hb A1C) (Completed)     Other   Anxiety    Stable Ok to use klonopin infrequently - last refill in 11/2017 Has declined SSRI      HLD (hyperlipidemia)    Continue statin Recheck lipids at CPE      Relevant Medications   lisinopril (PRINIVIL,ZESTRIL) 10 MG tablet   lovastatin (MEVACOR) 40 MG tablet       Return in about 3 months (around 12/01/2018) for CPE.   The entirety of the information documented in the History of Present Illness, Review of Systems and Physical Exam were personally obtained by me. Portions of this information were initially documented by [facility], CMA and reviewed by me for thoroughness and accuracy.    Virginia Crews, MD, MPH [facility] 08/31/2018 4:47 PM

## 2018-08-31 NOTE — Assessment & Plan Note (Signed)
Continue gabapentin.

## 2018-08-31 NOTE — Assessment & Plan Note (Signed)
Continue statin Recheck lipids at CPE

## 2018-08-31 NOTE — Patient Instructions (Signed)
Diabetes Mellitus and Nutrition, Adult  When you have diabetes (diabetes mellitus), it is very important to have healthy eating habits because your blood sugar (glucose) levels are greatly affected by what you eat and drink. Eating healthy foods in the appropriate amounts, at about the same times every day, can help you:  · Control your blood glucose.  · Lower your risk of heart disease.  · Improve your blood pressure.  · Reach or maintain a healthy weight.  Every person with diabetes is different, and each person has different needs for a meal plan. Your health care provider may recommend that you work with a diet and nutrition specialist (dietitian) to make a meal plan that is best for you. Your meal plan may vary depending on factors such as:  · The calories you need.  · The medicines you take.  · Your weight.  · Your blood glucose, blood pressure, and cholesterol levels.  · Your activity level.  · Other health conditions you have, such as heart or kidney disease.  How do carbohydrates affect me?  Carbohydrates, also called carbs, affect your blood glucose level more than any other type of food. Eating carbs naturally raises the amount of glucose in your blood. Carb counting is a method for keeping track of how many carbs you eat. Counting carbs is important to keep your blood glucose at a healthy level, especially if you use insulin or take certain oral diabetes medicines.  It is important to know how many carbs you can safely have in each meal. This is different for every person. Your dietitian can help you calculate how many carbs you should have at each meal and for each snack.  Foods that contain carbs include:  · Bread, cereal, rice, pasta, and crackers.  · Potatoes and corn.  · Peas, beans, and lentils.  · Milk and yogurt.  · Fruit and juice.  · Desserts, such as cakes, cookies, ice cream, and candy.  How does alcohol affect me?  Alcohol can cause a sudden decrease in blood glucose (hypoglycemia),  especially if you use insulin or take certain oral diabetes medicines. Hypoglycemia can be a life-threatening condition. Symptoms of hypoglycemia (sleepiness, dizziness, and confusion) are similar to symptoms of having too much alcohol.  If your health care provider says that alcohol is safe for you, follow these guidelines:  · Limit alcohol intake to no more than 1 drink per day for nonpregnant women and 2 drinks per day for men. One drink equals 12 oz of beer, 5 oz of wine, or 1½ oz of hard liquor.  · Do not drink on an empty stomach.  · Keep yourself hydrated with water, diet soda, or unsweetened iced tea.  · Keep in mind that regular soda, juice, and other mixers may contain a lot of sugar and must be counted as carbs.  What are tips for following this plan?    Reading food labels  · Start by checking the serving size on the "Nutrition Facts" label of packaged foods and drinks. The amount of calories, carbs, fats, and other nutrients listed on the label is based on one serving of the item. Many items contain more than one serving per package.  · Check the total grams (g) of carbs in one serving. You can calculate the number of servings of carbs in one serving by dividing the total carbs by 15. For example, if a food has 30 g of total carbs, it would be equal to 2   servings of carbs.  · Check the number of grams (g) of saturated and trans fats in one serving. Choose foods that have low or no amount of these fats.  · Check the number of milligrams (mg) of salt (sodium) in one serving. Most people should limit total sodium intake to less than 2,300 mg per day.  · Always check the nutrition information of foods labeled as "low-fat" or "nonfat". These foods may be higher in added sugar or refined carbs and should be avoided.  · Talk to your dietitian to identify your daily goals for nutrients listed on the label.  Shopping  · Avoid buying canned, premade, or processed foods. These foods tend to be high in fat, sodium,  and added sugar.  · Shop around the outside edge of the grocery store. This includes fresh fruits and vegetables, bulk grains, fresh meats, and fresh dairy.  Cooking  · Use low-heat cooking methods, such as baking, instead of high-heat cooking methods like deep frying.  · Cook using healthy oils, such as olive, canola, or sunflower oil.  · Avoid cooking with butter, cream, or high-fat meats.  Meal planning  · Eat meals and snacks regularly, preferably at the same times every day. Avoid going long periods of time without eating.  · Eat foods high in fiber, such as fresh fruits, vegetables, beans, and whole grains. Talk to your dietitian about how many servings of carbs you can eat at each meal.  · Eat 4-6 ounces (oz) of lean protein each day, such as lean meat, chicken, fish, eggs, or tofu. One oz of lean protein is equal to:  ? 1 oz of meat, chicken, or fish.  ? 1 egg.  ? ¼ cup of tofu.  · Eat some foods each day that contain healthy fats, such as avocado, nuts, seeds, and fish.  Lifestyle  · Check your blood glucose regularly.  · Exercise regularly as told by your health care provider. This may include:  ? 150 minutes of moderate-intensity or vigorous-intensity exercise each week. This could be brisk walking, biking, or water aerobics.  ? Stretching and doing strength exercises, such as yoga or weightlifting, at least 2 times a week.  · Take medicines as told by your health care provider.  · Do not use any products that contain nicotine or tobacco, such as cigarettes and e-cigarettes. If you need help quitting, ask your health care provider.  · Work with a counselor or diabetes educator to identify strategies to manage stress and any emotional and social challenges.  Questions to ask a health care provider  · Do I need to meet with a diabetes educator?  · Do I need to meet with a dietitian?  · What number can I call if I have questions?  · When are the best times to check my blood glucose?  Where to find more  information:  · American Diabetes Association: diabetes.org  · Academy of Nutrition and Dietetics: www.eatright.org  · National Institute of Diabetes and Digestive and Kidney Diseases (NIH): www.niddk.nih.gov  Summary  · A healthy meal plan will help you control your blood glucose and maintain a healthy lifestyle.  · Working with a diet and nutrition specialist (dietitian) can help you make a meal plan that is best for you.  · Keep in mind that carbohydrates (carbs) and alcohol have immediate effects on your blood glucose levels. It is important to count carbs and to use alcohol carefully.  This information is not intended to   replace advice given to you by your health care provider. Make sure you discuss any questions you have with your health care provider.  Document Released: 03/05/2005 Document Revised: 01/06/2017 Document Reviewed: 07/13/2016  Elsevier Interactive Patient Education © 2019 Elsevier Inc.

## 2018-08-31 NOTE — Assessment & Plan Note (Signed)
Well controlled Trying to d/c Lantus Decrease Lantus to 5 units Increase Trulicity to 3.3KP Continue metformin Ok to stop lantus if BG well controlled F/u in 3 months and recheck A1c

## 2018-08-31 NOTE — Assessment & Plan Note (Signed)
Stable Ok to use klonopin infrequently - last refill in 11/2017 Has declined SSRI

## 2018-09-28 ENCOUNTER — Other Ambulatory Visit: Payer: Self-pay

## 2018-09-28 DIAGNOSIS — R232 Flushing: Secondary | ICD-10-CM

## 2018-09-28 MED ORDER — PROGESTERONE MICRONIZED 200 MG PO CAPS: 200.0000 mg | ORAL_CAPSULE | Freq: Every day | ORAL | 3 refills | Status: DC

## 2018-09-28 MED FILL — TRULICITY 1.5 MG/0.5 ML PEN: 1.5 | 28 days supply | Qty: 2 | Fill #0 | Status: TO

## 2018-09-28 MED FILL — ESTRADIOL 1 MG TABS: 1 | 30 days supply | Qty: 30 | Fill #0

## 2018-09-28 MED FILL — LISINOPRIL 10 MG TABLET: 10 | 90 days supply | Qty: 90 | Fill #0

## 2018-09-30 MED FILL — GABAPENTIN 400 MG CAPS: 400 | 90 days supply | Qty: 90 | Fill #0

## 2018-10-13 ENCOUNTER — Other Ambulatory Visit: Payer: Self-pay | Admitting: Family Medicine

## 2018-10-13 DIAGNOSIS — R232 Flushing: Secondary | ICD-10-CM

## 2018-10-13 MED FILL — PROGESTERONE 200 MG CAPSULE: 200 | 84 days supply | Qty: 36 | Fill #0

## 2018-10-13 NOTE — Telephone Encounter (Signed)
Can you check with the patient and see if she is still seeing her GYN who is filling this?

## 2018-10-13 NOTE — Telephone Encounter (Signed)
[facility] faxed refill request for the following medications:  estradiol (ESTRACE) 1 MG tablet   Please advise.

## 2018-10-13 NOTE — Telephone Encounter (Signed)
Was never filled by Dr.Bacigalupo. L.O.V. 08/31/2018, please advise.

## 2018-10-14 NOTE — Telephone Encounter (Signed)
Patient states she have not been to see her GYN doctor and would like for you to started filling the medication for her. Please advise.

## 2018-10-15 ENCOUNTER — Other Ambulatory Visit: Payer: Self-pay | Admitting: Obstetrics and Gynecology

## 2018-10-15 DIAGNOSIS — R232 Flushing: Secondary | ICD-10-CM

## 2018-10-17 MED ORDER — ESTRADIOL 1 MG PO TABS: 1.0000 mg | ORAL_TABLET | Freq: Every day | ORAL | 11 refills | Status: DC

## 2018-10-17 NOTE — Telephone Encounter (Signed)
Please advise 

## 2018-10-22 MED FILL — ESTRADIOL 1 MG TABS: 1 | 90 days supply | Qty: 90 | Fill #0

## 2018-11-30 ENCOUNTER — Other Ambulatory Visit: Payer: Self-pay

## 2018-11-30 ENCOUNTER — Encounter: Payer: Self-pay | Admitting: Family Medicine

## 2018-11-30 ENCOUNTER — Ambulatory Visit (INDEPENDENT_AMBULATORY_CARE_PROVIDER_SITE_OTHER): Payer: 59 | Admitting: Family Medicine

## 2018-11-30 VITALS — BP 110/79 | HR 92 | Temp 98.8°F | Wt 134.0 lb

## 2018-11-30 DIAGNOSIS — L245 Irritant contact dermatitis due to other chemical products: Secondary | ICD-10-CM | POA: Diagnosis not present

## 2018-11-30 DIAGNOSIS — Z1211 Encounter for screening for malignant neoplasm of colon: Secondary | ICD-10-CM | POA: Diagnosis not present

## 2018-11-30 DIAGNOSIS — E7800 Pure hypercholesterolemia, unspecified: Secondary | ICD-10-CM

## 2018-11-30 DIAGNOSIS — Z794 Long term (current) use of insulin: Secondary | ICD-10-CM

## 2018-11-30 DIAGNOSIS — Z0000 Encounter for general adult medical examination without abnormal findings: Secondary | ICD-10-CM | POA: Diagnosis not present

## 2018-11-30 DIAGNOSIS — F419 Anxiety disorder, unspecified: Secondary | ICD-10-CM

## 2018-11-30 DIAGNOSIS — E119 Type 2 diabetes mellitus without complications: Secondary | ICD-10-CM

## 2018-11-30 MED ORDER — TRIAMCINOLONE ACETONIDE 0.5 % EX OINT: 1.0000 "application " | TOPICAL_OINTMENT | Freq: Two times a day (BID) | CUTANEOUS | 1 refills | Status: AC

## 2018-11-30 NOTE — Progress Notes (Signed)
Patient: Lynn Coleman, Female    DOB: 16-Jun-1971, 48 y.o.   MRN: [id number] Visit Date: 11/30/2018  Today's Provider: Lavon Paganini, MD   Chief Complaint  Patient presents with   Annual Exam   Subjective:     Annual physical exam Lynn Coleman is a 48 y.o. female who presents today for health maintenance and complete physical. She feels well. She reports exercising 3 times per week. She reports she is sleeping fairly well.  Never had colon cancer screening - would like to do Cologuard  Rash on neck x3d Itchy Worse at night Recently had hair done and hadn't had it in a while -----------------------------------------------------------------   Review of Systems  Constitutional: Positive for unexpected weight change.  HENT: Negative.   Eyes: Negative.   Respiratory: Negative.   Cardiovascular: Negative.   Gastrointestinal: Negative.   Endocrine: Negative.   Genitourinary: Negative.   Musculoskeletal: Negative.   Skin: Positive for rash.  Allergic/Immunologic: Negative.   Neurological: Negative.   Hematological: Negative.   Psychiatric/Behavioral: Negative.     Social History      She  reports that she has never smoked. She has never used smokeless tobacco. She reports current alcohol use. She reports that she does not use drugs.       Social History   Socioeconomic History   Marital status: Married    Spouse name: Darrell   Number of children: 0   Years of education: Not on file   Highest education level: Doctorate  Occupational History    Employer: [employer]  Social Needs   Financial resource strain: Not hard at all   Food insecurity:    Worry: Never true    Inability: Never true   Transportation needs:    Medical: No    Non-medical: No  Tobacco Use   Smoking status: Never Smoker   Smokeless tobacco: Never Used  Substance and Sexual Activity   Alcohol use: Yes    Comment: occasional. 1 glass of wine 2-3 times per month     Drug use: No   Sexual activity: Yes    Partners: Male    Birth control/protection: Patch  Lifestyle   Physical activity:    Days per week: 3 days    Minutes per session: 30 min   Stress: Not on file  Relationships   Social connections:    Talks on phone: Not on file    Gets together: Not on file    Attends religious service: Not on file    Active member of club or organization: Not on file    Attends meetings of clubs or organizations: Not on file    Relationship status: Not on file  Other Topics Concern   Not on file  Social History Narrative   Not on file    Past Medical History:  Diagnosis Date   Allergic rhinitis    Anxiety    Diabetes mellitus without complication ([place])    History of chicken pox    Hyperlipidemia      Patient Active Problem List   Diagnosis Date Noted   Allergic rhinitis    Type 2 diabetes mellitus without complication, with long-term current use of insulin ([facility]) 03/17/2017   Anxiety 03/17/2017   HLD (hyperlipidemia) 03/17/2017   Hot flashes 03/17/2017    Past Surgical History:  Procedure Laterality Date   PLEURAL SCARIFICATION      Family History        Family Status  Relation Name Status   Mother  Deceased   Father  Deceased   Sister  Alive   Brother  Deceased   MGM  (Not Specified)   MGF  (Not Specified)   PGM  (Not Specified)   Neg Hx  (Not Specified)        Her family history includes Arthritis in her mother and paternal grandmother; Asthma in her maternal grandmother; Dementia in her mother; Gastric cancer in her maternal grandfather; Glaucoma in her brother; Heart disease in her mother; Hyperlipidemia in her mother; Hypertension in her father, mother, and sister; Lung cancer in her father; Pancreatic cancer in her brother; Stomach cancer in her maternal grandfather. There is no history of Breast cancer or Colon cancer.      No Known Allergies   Current Outpatient Medications:    cetirizine  (ZYRTEC) 10 MG tablet, Take 10 mg by mouth daily., Disp: , Rfl:    cholecalciferol (VITAMIN D) 400 units TABS tablet, Take 400 Units by mouth., Disp: , Rfl:    clonazePAM (KLONOPIN) 0.5 MG tablet, Take 1 tablet (0.5 mg total) by mouth 2 (two) times daily as needed., Disp: 30 tablet, Rfl: 1   Dulaglutide (TRULICITY) 1.5 GY/5.6LS SOPN, Inject 1.5 mg into the skin once a week., Disp: 4 pen, Rfl: 5   estradiol (ESTRACE) 1 MG tablet, TAKE 1 TABLET (1 MG TOTAL) BY MOUTH DAILY., Disp: 30 tablet, Rfl: 0   gabapentin (NEURONTIN) 400 MG capsule, Take 1 capsule (400 mg total) by mouth at bedtime., Disp: 90 capsule, Rfl: 3   glucose blood (ONE TOUCH ULTRA TEST) test strip, Use 2 (two) times daily., Disp: , Rfl:    LANTUS SOLOSTAR 100 UNIT/ML Solostar Pen, Inject 5 Units into the skin daily at 10 pm., Disp: 15 mL, Rfl: 1   lisinopril (PRINIVIL,ZESTRIL) 10 MG tablet, Take 1 tablet (10 mg total) by mouth daily., Disp: 90 tablet, Rfl: 3   lovastatin (MEVACOR) 40 MG tablet, Take 1 tablet (40 mg total) by mouth at bedtime., Disp: 90 tablet, Rfl: 3   metFORMIN (GLUCOPHAGE) 1000 MG tablet, Take 1 tablet (1,000 mg total) by mouth 2 (two) times daily with a meal., Disp: 180 tablet, Rfl: 3   Multiple Vitamin (MULTIVITAMIN) tablet, Take 1 tablet by mouth daily., Disp: , Rfl:    progesterone (PROMETRIUM) 200 MG capsule, Take 1 capsule (200 mg total) by mouth daily. Take calendar days 1-12 each month, Disp: 36 capsule, Rfl: 3   triamcinolone ointment (KENALOG) 0.5 %, Apply 1 application topically 2 (two) times daily., Disp: 30 g, Rfl: 1   Patient Care Team: Virginia Crews, MD as PCP - General (Family Medicine)    Objective:    Vitals: BP 110/79 (BP Location: Right Arm, Patient Position: Sitting, Cuff Size: Normal)    Pulse 92    Temp 98.8 F (37.1 C) (Oral)    Wt 134 lb (60.8 kg)    BMI 20.37 kg/m    Vitals:   11/30/18 1341  BP: 110/79  Pulse: 92  Temp: 98.8 F (37.1 C)  TempSrc: Oral    Weight: 134 lb (60.8 kg)     Physical Exam Vitals signs reviewed.  Constitutional:      General: She is not in acute distress.    Appearance: Normal appearance. She is well-developed. She is not diaphoretic.  HENT:     Head: Normocephalic and atraumatic.     Right Ear: Tympanic membrane, ear canal and external ear normal.  Left Ear: Tympanic membrane, ear canal and external ear normal.  Eyes:     General: No scleral icterus.    Conjunctiva/sclera: Conjunctivae normal.     Pupils: Pupils are equal, round, and reactive to light.  Neck:     Musculoskeletal: Neck supple.     Thyroid: No thyromegaly.  Cardiovascular:     Rate and Rhythm: Normal rate and regular rhythm.     Pulses: Normal pulses.     Heart sounds: Normal heart sounds. No murmur.  Pulmonary:     Effort: Pulmonary effort is normal. No respiratory distress.     Breath sounds: Normal breath sounds. No wheezing or rales.  Abdominal:     General: There is no distension.     Palpations: Abdomen is soft.     Tenderness: There is no abdominal tenderness.  Musculoskeletal:        General: No deformity.     Right lower leg: No edema.     Left lower leg: No edema.  Lymphadenopathy:     Cervical: No cervical adenopathy.  Skin:    General: Skin is warm and dry.     Capillary Refill: Capillary refill takes less than 2 seconds.     Findings: No rash.  Neurological:     Mental Status: She is alert and oriented to person, place, and time. Mental status is at baseline.  Psychiatric:        Mood and Affect: Mood normal.        Behavior: Behavior normal.        Thought Content: Thought content normal.      Depression Screen PHQ 2/9 Scores 11/30/2018 10/06/2017 03/17/2017  PHQ - 2 Score 0 0 0  PHQ- 9 Score 3 - -       Assessment & Plan:     Routine Health Maintenance and Physical Exam  Exercise Activities and Dietary recommendations Goals   None     Immunization History  Administered Date(s) Administered    IPV 10/26/1973, 11/23/1973, 12/21/1973, 08/09/1991   Influenza,inj,Quad PF,6+ Mos 03/17/2017   Influenza-Unspecified 03/22/2014, 03/22/2016, 03/21/2018   MMR 07/04/1985, 08/09/1991, 02/07/1992   Pneumococcal Polysaccharide-23 04/02/2014   Tdap 12/07/2011    Health Maintenance  Topic Date Due   OPHTHALMOLOGY EXAM  11/25/2018   HIV Screening  12/09/2018 (Originally 09/10/1984)   INFLUENZA VACCINE  01/21/2019   HEMOGLOBIN A1C  03/03/2019   FOOT EXAM  08/31/2019   PAP SMEAR-Modifier  10/16/2019   TETANUS/TDAP  12/06/2021   PNEUMOCOCCAL POLYSACCHARIDE VACCINE AGE 63-64 HIGH RISK  Completed     Discussed health benefits of physical activity, and encouraged her to engage in regular exercise appropriate for her age and condition.    --------------------------------------------------------------------   Problem List Items Addressed This Visit      Endocrine   Type 2 diabetes mellitus without complication, with long-term current use of insulin ([facility])   Relevant Orders   Hemoglobin A1c     Other   Anxiety   HLD (hyperlipidemia)   Relevant Orders   Lipid panel   Comprehensive metabolic panel    Other Visit Diagnoses    Encounter for annual physical exam    -  Primary   Relevant Orders   Lipid panel   Hemoglobin A1c   Comprehensive metabolic panel   CBC w/Diff/Platelet   Irritant contact dermatitis due to other chemical products       Screen for colon cancer       Relevant Orders   Cologuard  Return in about 6 months (around 06/01/2019) for chronic disease f/u.   The entirety of the information documented in the History of Present Illness, Review of Systems and Physical Exam were personally obtained by me. Portions of this information were initially documented by Tiburcio Pea, CMA and reviewed by me for thoroughness and accuracy.    Lesleigh Hughson, Dionne Bucy, MD MPH [facility]

## 2018-11-30 NOTE — Patient Instructions (Signed)
Preventive Care 40-64 Years, Female Preventive care refers to lifestyle choices and visits with your health care provider that can promote health and wellness. What does preventive care include?   A yearly physical exam. This is also called an annual well check.  Dental exams once or twice a year.  Routine eye exams. Ask your health care provider how often you should have your eyes checked.  Personal lifestyle choices, including: ? Daily care of your teeth and gums. ? Regular physical activity. ? Eating a healthy diet. ? Avoiding tobacco and drug use. ? Limiting alcohol use. ? Practicing safe sex. ? Taking low-dose aspirin daily starting at age 50. ? Taking vitamin and mineral supplements as recommended by your health care provider. What happens during an annual well check? The services and screenings done by your health care provider during your annual well check will depend on your age, overall health, lifestyle risk factors, and family history of disease. Counseling Your health care provider may ask you questions about your:  Alcohol use.  Tobacco use.  Drug use.  Emotional well-being.  Home and relationship well-being.  Sexual activity.  Eating habits.  Work and work environment.  Method of birth control.  Menstrual cycle.  Pregnancy history. Screening You may have the following tests or measurements:  Height, weight, and BMI.  Blood pressure.  Lipid and cholesterol levels. These may be checked every 5 years, or more frequently if you are over 50 years old.  Skin check.  Lung cancer screening. You may have this screening every year starting at age 55 if you have a 30-pack-year history of smoking and currently smoke or have quit within the past 15 years.  Colorectal cancer screening. All adults should have this screening starting at age 50 and continuing until age 75. Your health care provider may recommend screening at age 45. You will have tests every  1-10 years, depending on your results and the type of screening test. People at increased risk should start screening at an earlier age. Screening tests may include: ? Guaiac-based fecal occult blood testing. ? Fecal immunochemical test (FIT). ? Stool DNA test. ? Virtual colonoscopy. ? Sigmoidoscopy. During this test, a flexible tube with a tiny camera (sigmoidoscope) is used to examine your rectum and lower colon. The sigmoidoscope is inserted through your anus into your rectum and lower colon. ? Colonoscopy. During this test, a long, thin, flexible tube with a tiny camera (colonoscope) is used to examine your entire colon and rectum.  Hepatitis C blood test.  Hepatitis B blood test.  Sexually transmitted disease (STD) testing.  Diabetes screening. This is done by checking your blood sugar (glucose) after you have not eaten for a while (fasting). You may have this done every 1-3 years.  Mammogram. This may be done every 1-2 years. Talk to your health care provider about when you should start having regular mammograms. This may depend on whether you have a family history of breast cancer.  BRCA-related cancer screening. This may be done if you have a family history of breast, ovarian, tubal, or peritoneal cancers.  Pelvic exam and Pap test. This may be done every 3 years starting at age 21. Starting at age 30, this may be done every 5 years if you have a Pap test in combination with an HPV test.  Bone density scan. This is done to screen for osteoporosis. You may have this scan if you are at high risk for osteoporosis. Discuss your test results, treatment options,   and if necessary, the need for more tests with your health care provider. Vaccines Your health care provider may recommend certain vaccines, such as:  Influenza vaccine. This is recommended every year.  Tetanus, diphtheria, and acellular pertussis (Tdap, Td) vaccine. You may need a Td booster every 10 years.  Varicella  vaccine. You may need this if you have not been vaccinated.  Zoster vaccine. You may need this after age 38.  Measles, mumps, and rubella (MMR) vaccine. You may need at least one dose of MMR if you were born in 1957 or later. You may also need a second dose.  Pneumococcal 13-valent conjugate (PCV13) vaccine. You may need this if you have certain conditions and were not previously vaccinated.  Pneumococcal polysaccharide (PPSV23) vaccine. You may need one or two doses if you smoke cigarettes or if you have certain conditions.  Meningococcal vaccine. You may need this if you have certain conditions.  Hepatitis A vaccine. You may need this if you have certain conditions or if you travel or work in places where you may be exposed to hepatitis A.  Hepatitis B vaccine. You may need this if you have certain conditions or if you travel or work in places where you may be exposed to hepatitis B.  Haemophilus influenzae type b (Hib) vaccine. You may need this if you have certain conditions. Talk to your health care provider about which screenings and vaccines you need and how often you need them. This information is not intended to replace advice given to you by your health care provider. Make sure you discuss any questions you have with your health care provider. Document Released: 07/05/2015 Document Revised: 07/29/2017 Document Reviewed: 04/09/2015 Elsevier Interactive Patient Education  2019 [employer].

## 2018-12-01 ENCOUNTER — Encounter: Payer: Self-pay | Admitting: Family Medicine

## 2018-12-01 LAB — COMPREHENSIVE METABOLIC PANEL
ALT: 9 IU/L (ref 0–32)
AST: 11 IU/L (ref 0–40)
Albumin/Globulin Ratio: 1.4 (ref 1.2–2.2)
Albumin: 4.3 g/dL (ref 3.8–4.8)
Alkaline Phosphatase: 61 IU/L (ref 39–117)
BUN/Creatinine Ratio: 14 (ref 9–23)
BUN: 9 mg/dL (ref 6–24)
Bilirubin Total: 0.3 mg/dL (ref 0.0–1.2)
CO2: 25 mmol/L (ref 20–29)
Calcium: 9.8 mg/dL (ref 8.7–10.2)
Chloride: 100 mmol/L (ref 96–106)
Creatinine, Ser: 0.64 mg/dL (ref 0.57–1.00)
GFR calc Af Amer: 121 mL/min/{1.73_m2} (ref >59)
GFR calc non Af Amer: 105 mL/min/{1.73_m2} (ref >59)
Globulin, Total: 3 g/dL (ref 1.5–4.5)
Glucose: 73 mg/dL (ref 65–99)
Potassium: 4.4 mmol/L (ref 3.5–5.2)
Sodium: 140 mmol/L (ref 134–144)
Total Protein: 7.3 g/dL (ref 6.0–8.5)

## 2018-12-01 LAB — CBC WITH DIFFERENTIAL/PLATELET
Basophils Absolute: 0 10*3/uL (ref 0.0–0.2)
Basos: 0 %
EOS (ABSOLUTE): 0.2 10*3/uL (ref 0.0–0.4)
Eos: 2 %
Hematocrit: 39.8 % (ref 34.0–46.6)
Hemoglobin: 12.9 g/dL (ref 11.1–15.9)
Immature Grans (Abs): 0 10*3/uL (ref 0.0–0.1)
Immature Granulocytes: 0 %
Lymphocytes Absolute: 3 10*3/uL (ref 0.7–3.1)
Lymphs: 39 %
MCH: 27.4 pg (ref 26.6–33.0)
MCHC: 32.4 g/dL (ref 31.5–35.7)
MCV: 85 fL (ref 79–97)
Monocytes Absolute: 0.5 10*3/uL (ref 0.1–0.9)
Monocytes: 7 %
Neutrophils Absolute: 3.9 10*3/uL (ref 1.4–7.0)
Neutrophils: 52 %
Platelets: 317 10*3/uL (ref 150–450)
RBC: 4.71 x10E6/uL (ref 3.77–5.28)
RDW: 12.9 % (ref 11.7–15.4)
WBC: 7.6 10*3/uL (ref 3.4–10.8)

## 2018-12-01 LAB — LIPID PANEL
Chol/HDL Ratio: 2.8 ratio (ref 0.0–4.4)
Cholesterol, Total: 147 mg/dL (ref 100–199)
HDL: 53 mg/dL (ref >39)
LDL Calculated: 85 mg/dL (ref 0–99)
Triglycerides: 46 mg/dL (ref 0–149)
VLDL Cholesterol Cal: 9 mg/dL (ref 5–40)

## 2018-12-01 LAB — HEMOGLOBIN A1C
Est. average glucose Bld gHb Est-mCnc: 177 mg/dL
Hgb A1c MFr Bld: 7.8 % — ABNORMAL HIGH (ref 4.8–5.6)

## 2018-12-02 MED ORDER — LANTUS SOLOSTAR 100 UNIT/ML ~~LOC~~ SOPN: 10.0000 [IU] | PEN_INJECTOR | Freq: Every day | SUBCUTANEOUS | 1 refills | Status: DC

## 2018-12-28 ENCOUNTER — Encounter: Payer: Self-pay | Admitting: Family Medicine

## 2019-01-25 DIAGNOSIS — H524 Presbyopia: Secondary | ICD-10-CM | POA: Diagnosis not present

## 2019-01-25 LAB — HM DIABETES EYE EXAM

## 2019-01-30 ENCOUNTER — Telehealth: Payer: Self-pay

## 2019-01-30 NOTE — Telephone Encounter (Signed)
-----   Message from Virginia Crews, MD sent at 01/30/2019 12:21 PM EDT ----- Patient has not completed cologuard. Please remind her of this order. ----- Message ----- From: Truitt Merle Sent: 01/30/2019  11:31 AM EDT To: Virginia Crews, MD  Review incoming fax

## 2019-01-30 NOTE — Telephone Encounter (Signed)
Patient advised. She states she will do the test this week and send it in.

## 2019-02-04 DIAGNOSIS — Z1211 Encounter for screening for malignant neoplasm of colon: Secondary | ICD-10-CM | POA: Diagnosis not present

## 2019-02-12 LAB — COLOGUARD: Cologuard: NEGATIVE

## 2019-03-01 ENCOUNTER — Other Ambulatory Visit: Payer: Self-pay | Admitting: Family Medicine

## 2019-04-24 ENCOUNTER — Encounter: Payer: Self-pay | Admitting: Family Medicine

## 2019-04-25 MED ORDER — TRULICITY 0.75 MG/0.5ML ~~LOC~~ SOAJ: 0.7500 mg | SUBCUTANEOUS | 5 refills | Status: DC

## 2019-04-25 MED ORDER — LANTUS SOLOSTAR 100 UNIT/ML ~~LOC~~ SOPN: 10.0000 [IU] | PEN_INJECTOR | Freq: Every day | SUBCUTANEOUS | 1 refills | Status: DC

## 2019-05-03 ENCOUNTER — Other Ambulatory Visit: Payer: Self-pay | Admitting: Family Medicine

## 2019-05-03 DIAGNOSIS — Z1231 Encounter for screening mammogram for malignant neoplasm of breast: Secondary | ICD-10-CM

## 2019-05-07 ENCOUNTER — Encounter: Payer: Self-pay | Admitting: Family Medicine

## 2019-05-08 NOTE — Telephone Encounter (Signed)
Is this okay to do?  Or would you like for her to schedule an appointment to discuss?  Thanks,   -Mickel Baas

## 2019-05-10 ENCOUNTER — Encounter: Payer: Self-pay | Admitting: Family Medicine

## 2019-05-10 DIAGNOSIS — Z20828 Contact with and (suspected) exposure to other viral communicable diseases: Secondary | ICD-10-CM

## 2019-05-10 DIAGNOSIS — Z20822 Contact with and (suspected) exposure to covid-19: Secondary | ICD-10-CM

## 2019-05-10 MED ORDER — ESCITALOPRAM OXALATE 10 MG PO TABS: ORAL_TABLET | ORAL | 1 refills | Status: DC

## 2019-05-17 ENCOUNTER — Ambulatory Visit
Admission: RE | Admit: 2019-05-17 | Discharge: 2019-05-17 | Disposition: A | Discharge status: Home / Self Care | Payer: 59 | Source: Ambulatory Visit | Attending: Family Medicine | Admitting: Family Medicine

## 2019-05-17 DIAGNOSIS — Z1231 Encounter for screening mammogram for malignant neoplasm of breast: Secondary | ICD-10-CM | POA: Diagnosis not present

## 2019-05-24 ENCOUNTER — Telehealth: Payer: Self-pay

## 2019-05-24 NOTE — Telephone Encounter (Signed)
Patient advised as below.  

## 2019-05-24 NOTE — Telephone Encounter (Signed)
-----   Message from Virginia Crews, MD sent at 05/22/2019 11:48 AM EST ----- Normal mammogram. Repeat in 1 yr

## 2019-05-31 ENCOUNTER — Ambulatory Visit: Payer: 59 | Admitting: Family Medicine

## 2019-05-31 ENCOUNTER — Encounter: Payer: Self-pay | Admitting: Family Medicine

## 2019-05-31 ENCOUNTER — Other Ambulatory Visit: Payer: Self-pay

## 2019-05-31 VITALS — BP 110/74 | HR 81 | Temp 96.7°F | Wt 131.0 lb

## 2019-05-31 DIAGNOSIS — E119 Type 2 diabetes mellitus without complications: Secondary | ICD-10-CM

## 2019-05-31 DIAGNOSIS — F419 Anxiety disorder, unspecified: Secondary | ICD-10-CM | POA: Diagnosis not present

## 2019-05-31 DIAGNOSIS — Z794 Long term (current) use of insulin: Secondary | ICD-10-CM

## 2019-05-31 DIAGNOSIS — E7800 Pure hypercholesterolemia, unspecified: Secondary | ICD-10-CM | POA: Diagnosis not present

## 2019-05-31 LAB — POCT GLYCOSYLATED HEMOGLOBIN (HGB A1C): Hemoglobin A1C: 6.2 % — AB (ref 4.0–5.6)

## 2019-05-31 MED ORDER — ATORVASTATIN CALCIUM 40 MG PO TABS: 40.0000 mg | ORAL_TABLET | Freq: Every day | ORAL | 3 refills | Status: DC

## 2019-05-31 MED ORDER — LANTUS SOLOSTAR 100 UNIT/ML ~~LOC~~ SOPN: 11.0000 [IU] | PEN_INJECTOR | Freq: Every day | SUBCUTANEOUS | 1 refills | Status: DC

## 2019-05-31 MED ORDER — FREESTYLE LITE DEVI: 0 refills | Status: DC

## 2019-05-31 MED ORDER — FREESTYLE LANCETS MISC: 12 refills | Status: AC

## 2019-05-31 MED ORDER — FREESTYLE LITE TEST VI STRP: ORAL_STRIP | 12 refills | Status: DC

## 2019-05-31 MED ORDER — GABAPENTIN 100 MG PO CAPS: 200.0000 mg | ORAL_CAPSULE | Freq: Every day | ORAL | 1 refills | Status: DC

## 2019-05-31 NOTE — Assessment & Plan Note (Signed)
Chronic Recent exacerbation in the setting of stress from the pandemic Doing well on Lexapro 10 mg daily which we will continue Okay to use Klonopin infrequently Continue counseling Return precautions discussed

## 2019-05-31 NOTE — Assessment & Plan Note (Signed)
Well-controlled Continue Trulicity 9.17 mg and Metformin Continue Lantus 11 to 12 units nightly Did not tolerate higher dose of Trulicity due to weight loss and hair loss Meter, strips, lancets given today to be able to check her blood sugar at home Up-to-date on foot exam, eye exam, vaccinations On ACE inhibitor and statin Follow-up in 6 months and recheck A1c

## 2019-05-31 NOTE — Progress Notes (Signed)
Patient: Lynn Coleman Female    DOB: 1971/02/15   48 y.o.   MRN: [id number] Visit Date: 05/31/2019  Today's Provider: Lavon Paganini, MD   Chief Complaint  Patient presents with  . Diabetes  . Hyperlipidemia   Subjective:     HPI    Diabetes Mellitus Type II, Follow-up:   Lab Results  Component Value Date   HGBA1C 7.8 (H) 11/30/2018   HGBA1C 6.7 (A) 08/31/2018   HGBA1C 6.4 (A) 05/25/2018   Last seen for diabetes 6 months ago.  Management since then includes Increased Lantus to 10 units a day.  Pt reports taking 11-12 Units once a day. She reports excellent compliance with treatment. She is not having side effects.  Current symptoms include none and have been stable. Home blood sugar records: Blood sugars are not being checked at home.  Episodes of hypoglycemia? yes - pt reports a hypoglycemic episode about twice a month   Most Recent Eye Exam: 01/25/2019 Weight trend: stable Current diet: in general, a "healthy" diet   Current exercise: walking  ------------------------------------------------------------------------   Lipid/Cholesterol, Follow-up:   Last seen for this 6 months ago.  Management since that visit includes Either change to Atorvastatin or increase lovastatin. Pt reports still taking Lovastatin 89m a day.  Last Lipid Panel:    Component Value Date/Time   CHOL 147 11/30/2018 1402   TRIG 46 11/30/2018 1402   HDL 53 11/30/2018 1402   CHOLHDL 2.8 11/30/2018 1402   LDLCALC 85 11/30/2018 1402    She reports good compliance with treatment.  She still had lovastatin left after last lipid panel, but is willing to switch to atorvastatin today as was discussed after her last labs She is not having side effects.   Wt Readings from Last 3 Encounters:  05/31/19 131 lb (59.4 kg)  11/30/18 134 lb (60.8 kg)  08/31/18 140 lb (63.5 kg)    ------------------------------------------------------------------------ Patient sent my chart message  about a month ago reporting depression and anxiety symptoms over the last few weeks with stress at work and home in the setting of the pandemic.  She is also seeing a therapist through the employee assistance program.  We discussed that she had taken Lexapro in the past and done well with that.  Lexapro was started at 5 mg daily and increased to 10 mg daily.  She is taking this with good compliance and denies any side effects.  She is taking her benzo very sparingly.  She reports good control of symptoms.   No Known Allergies   Current Outpatient Medications:  .  cetirizine (ZYRTEC) 10 MG tablet, Take 10 mg by mouth daily., Disp: , Rfl:  .  cholecalciferol (VITAMIN D) 400 units TABS tablet, Take 400 Units by mouth., Disp: , Rfl:  .  clonazePAM (KLONOPIN) 0.5 MG tablet, Take 1 tablet (0.5 mg total) by mouth 2 (two) times daily as needed., Disp: 30 tablet, Rfl: 1 .  Dulaglutide (TRULICITY) 02.99MBZ/1.6RCSOPN, Inject 0.75 mg into the skin once a week., Disp: 2 mL, Rfl: 5 .  escitalopram (LEXAPRO) 10 MG tablet, Take 1/2 tablet a day for 14 days then increase to 1 tablet a daily., Disp: 30 tablet, Rfl: 1 .  estradiol (ESTRACE) 1 MG tablet, TAKE 1 TABLET (1 MG TOTAL) BY MOUTH DAILY., Disp: 30 tablet, Rfl: 0 .  gabapentin (NEURONTIN) 400 MG capsule, Take 1 capsule (400 mg total) by mouth at bedtime., Disp: 90 capsule, Rfl: 3 .  glucose  blood (ONE TOUCH ULTRA TEST) test strip, Use 2 (two) times daily., Disp: , Rfl:  .  LANTUS SOLOSTAR 100 UNIT/ML Solostar Pen, Inject 10 Units into the skin daily at 10 pm., Disp: 15 mL, Rfl: 1 .  lisinopril (PRINIVIL,ZESTRIL) 10 MG tablet, Take 1 tablet (10 mg total) by mouth daily., Disp: 90 tablet, Rfl: 3 .  lovastatin (MEVACOR) 40 MG tablet, Take 1 tablet (40 mg total) by mouth at bedtime., Disp: 90 tablet, Rfl: 3 .  metFORMIN (GLUCOPHAGE) 1000 MG tablet, Take 1 tablet (1,000 mg total) by mouth 2 (two) times daily with a meal., Disp: 180 tablet, Rfl: 3 .  Multiple  Vitamin (MULTIVITAMIN) tablet, Take 1 tablet by mouth daily., Disp: , Rfl:  .  progesterone (PROMETRIUM) 200 MG capsule, Take 1 capsule (200 mg total) by mouth daily. Take calendar days 1-12 each month, Disp: 36 capsule, Rfl: 3 .  triamcinolone ointment (KENALOG) 0.5 %, Apply 1 application topically 2 (two) times daily., Disp: 30 g, Rfl: 1  Review of Systems  Constitutional: Negative.   Respiratory: Negative.   Cardiovascular: Negative.   Gastrointestinal: Positive for diarrhea (Occasionally). Negative for abdominal distention, abdominal pain, anal bleeding, blood in stool, constipation, nausea, rectal pain and vomiting.  Endocrine: Negative.   Musculoskeletal: Negative.   Neurological: Negative for dizziness, light-headedness and headaches.    Social History   Tobacco Use  . Smoking status: Never Smoker  . Smokeless tobacco: Never Used  Substance Use Topics  . Alcohol use: Yes    Comment: occasional. 1 glass of wine 2-3 times per month      Objective:   BP 110/74 (BP Location: Right Arm, Patient Position: Sitting, Cuff Size: Normal)   Pulse 81   Temp (!) 96.7 F (35.9 C) (Temporal)   Wt 131 lb (59.4 kg)   BMI 19.92 kg/m  Vitals:   05/31/19 1443  BP: 110/74  Pulse: 81  Temp: (!) 96.7 F (35.9 C)  TempSrc: Temporal  Weight: 131 lb (59.4 kg)  Body mass index is 19.92 kg/m.   Physical Exam Vitals signs reviewed.  Constitutional:      General: She is not in acute distress.    Appearance: Normal appearance. She is well-developed. She is not diaphoretic.  HENT:     Head: Normocephalic and atraumatic.  Eyes:     General: No scleral icterus.    Conjunctiva/sclera: Conjunctivae normal.  Neck:     Musculoskeletal: Neck supple.     Thyroid: No thyromegaly.  Cardiovascular:     Rate and Rhythm: Normal rate and regular rhythm.     Pulses: Normal pulses.     Heart sounds: Normal heart sounds. No murmur.  Pulmonary:     Effort: Pulmonary effort is normal. No  respiratory distress.     Breath sounds: Normal breath sounds. No wheezing, rhonchi or rales.  Abdominal:     General: There is no distension.     Palpations: Abdomen is soft.     Tenderness: There is no abdominal tenderness.  Musculoskeletal:     Right lower leg: No edema.     Left lower leg: No edema.  Lymphadenopathy:     Cervical: No cervical adenopathy.  Skin:    General: Skin is warm and dry.     Capillary Refill: Capillary refill takes less than 2 seconds.     Findings: No rash.  Neurological:     Mental Status: She is alert and oriented to person, place, and time. Mental status is at  baseline.  Psychiatric:        Mood and Affect: Mood normal.        Behavior: Behavior normal.      No results found for any visits on 05/31/19.     Assessment & Plan    Problem List Items Addressed This Visit      Endocrine   Type 2 diabetes mellitus without complication, with long-term current use of insulin (HCC) - Primary    Well-controlled Continue Trulicity 8.67 mg and Metformin Continue Lantus 11 to 12 units nightly Did not tolerate higher dose of Trulicity due to weight loss and hair loss Meter, strips, lancets given today to be able to check her blood sugar at home Up-to-date on foot exam, eye exam, vaccinations On ACE inhibitor and statin Follow-up in 6 months and recheck A1c      Relevant Medications   LANTUS SOLOSTAR 100 UNIT/ML Solostar Pen   atorvastatin (LIPITOR) 40 MG tablet   Other Relevant Orders   POCT HgB A1C (Completed)   Basic Metabolic Panel (BMET)     Other   Anxiety    Chronic Recent exacerbation in the setting of stress from the pandemic Doing well on Lexapro 10 mg daily which we will continue Okay to use Klonopin infrequently Continue counseling Return precautions discussed      Relevant Orders   TSH   HLD (hyperlipidemia)    Goal LDL less than 70 in the setting of T2DM Last LDL not quite to goal We will switch from lovastatin to  atorvastatin 40 mg daily Plan to recheck fasting lipid panel in 6 months      Relevant Medications   atorvastatin (LIPITOR) 40 MG tablet   Other Relevant Orders   Basic Metabolic Panel (BMET)       Return in about 6 months (around 11/29/2019) for CPE.   The entirety of the information documented in the History of Present Illness, Review of Systems and Physical Exam were personally obtained by me. Portions of this information were initially documented by Ashley Royalty, CMA and reviewed by me for thoroughness and accuracy.    Gaynor Ferreras, Dionne Bucy, MD MPH [facility]

## 2019-05-31 NOTE — Assessment & Plan Note (Signed)
Goal LDL less than 70 in the setting of T2DM Last LDL not quite to goal We will switch from lovastatin to atorvastatin 40 mg daily Plan to recheck fasting lipid panel in 6 months

## 2019-06-01 ENCOUNTER — Telehealth: Payer: Self-pay

## 2019-06-01 LAB — TSH: TSH: 1.16 u[IU]/mL (ref 0.450–4.500)

## 2019-06-01 LAB — BASIC METABOLIC PANEL
BUN/Creatinine Ratio: 22 (ref 9–23)
BUN: 13 mg/dL (ref 6–24)
CO2: 25 mmol/L (ref 20–29)
Calcium: 9.5 mg/dL (ref 8.7–10.2)
Chloride: 101 mmol/L (ref 96–106)
Creatinine, Ser: 0.58 mg/dL (ref 0.57–1.00)
GFR calc Af Amer: 125 mL/min/{1.73_m2} (ref >59)
GFR calc non Af Amer: 109 mL/min/{1.73_m2} (ref >59)
Glucose: 71 mg/dL (ref 65–99)
Potassium: 4.1 mmol/L (ref 3.5–5.2)
Sodium: 140 mmol/L (ref 134–144)

## 2019-06-01 NOTE — Telephone Encounter (Signed)
-----   Message from Virginia Crews, MD sent at 06/01/2019  8:30 AM EST ----- Normal labs

## 2019-06-01 NOTE — Telephone Encounter (Signed)
lmtcb or view results in mychart.

## 2019-06-02 NOTE — Telephone Encounter (Signed)
Left message with her husband to call us back or look at her mychart for results.

## 2019-08-23 ENCOUNTER — Telehealth: Payer: Self-pay | Admitting: Family Medicine

## 2019-08-23 NOTE — Telephone Encounter (Signed)
RX REFILL clonazePAM (KLONOPIN) 0.5 MG tablet [place], [address] Phone:  [phone]  Fax:  [phone]

## 2019-08-24 MED ORDER — CLONAZEPAM 0.5 MG PO TABS: 0.5000 mg | ORAL_TABLET | Freq: Two times a day (BID) | ORAL | 1 refills | Status: DC | PRN

## 2019-10-11 ENCOUNTER — Other Ambulatory Visit: Payer: Self-pay | Admitting: Family Medicine

## 2019-10-11 NOTE — Telephone Encounter (Signed)
Requested Prescriptions  Pending Prescriptions Disp Refills  . metFORMIN (GLUCOPHAGE) 1000 MG tablet [Pharmacy Med Name: metFORMIN HCL 1000 MG TABS 1000 Tablet] 180 tablet 0    Sig: TAKE 1 TABLET BY MOUTH 2 TIMES DAILY WITH A MEAL.     Endocrinology:  Diabetes - Biguanides Passed - 10/11/2019  4:12 PM      Passed - Cr in normal range and within 360 days    Creatinine, Ser  Date Value Ref Range Status  05/31/2019 0.58 0.57 - 1.00 mg/dL Final         Passed - HBA1C is between 0 and 7.9 and within 180 days    Hemoglobin A1C  Date Value Ref Range Status  05/31/2019 6.2 (A) 4.0 - 5.6 % Final   Hgb A1c MFr Bld  Date Value Ref Range Status  11/30/2018 7.8 (H) 4.8 - 5.6 % Final    Comment:             Prediabetes: 5.7 - 6.4          Diabetes: >6.4          Glycemic control for adults with diabetes: <7.0          Passed - eGFR in normal range and within 360 days    GFR calc Af Amer  Date Value Ref Range Status  05/31/2019 125 >59 mL/min/1.73 Final   GFR calc non Af Amer  Date Value Ref Range Status  05/31/2019 109 >59 mL/min/1.73 Final         Passed - Valid encounter within last 6 months    Recent Outpatient Visits          4 months ago Type 2 diabetes mellitus without complication, with long-term current use of insulin ([place])   [facility] [place], Dionne Bucy, MD   10 months ago Encounter for annual physical exam   [facility] [place], Dionne Bucy, MD   1 year ago Type 2 diabetes mellitus without complication, with long-term current use of insulin [facility])   [facility] [place], Dionne Bucy, MD   1 year ago Type 2 diabetes mellitus without complication, with long-term current use of insulin [facility])   [facility] [place], Dionne Bucy, MD   1 year ago Type 2 diabetes mellitus without complication, with long-term current use of insulin ([place])   [facility], Dionne Bucy, MD             .  TRULICITY 2.37 SE/8.3TD SOPN [Pharmacy Med Name: TRULICITY 1.76 HY/0.7 ML PE 0.75 Solution Pen-injector] 6 mL 1    Sig: INJECT 0.75 MG INTO THE SKIN ONCE A WEEK.     Endocrinology:  Diabetes - GLP-1 Receptor Agonists Passed - 10/11/2019  4:12 PM      Passed - HBA1C is between 0 and 7.9 and within 180 days    Hemoglobin A1C  Date Value Ref Range Status  05/31/2019 6.2 (A) 4.0 - 5.6 % Final   Hgb A1c MFr Bld  Date Value Ref Range Status  11/30/2018 7.8 (H) 4.8 - 5.6 % Final    Comment:             Prediabetes: 5.7 - 6.4          Diabetes: >6.4          Glycemic control for adults with diabetes: <7.0          Passed - Valid encounter within last 6 months    Recent  Outpatient Visits          4 months ago Type 2 diabetes mellitus without complication, with long-term current use of insulin [facility])   [facility] [place], Dionne Bucy, MD   10 months ago Encounter for annual physical exam   [facility] [place], Dionne Bucy, MD   1 year ago Type 2 diabetes mellitus without complication, with long-term current use of insulin [facility])   [facility], Dionne Bucy, MD   1 year ago Type 2 diabetes mellitus without complication, with long-term current use of insulin [facility])   [facility], Dionne Bucy, MD   1 year ago Type 2 diabetes mellitus without complication, with long-term current use of insulin [facility])   [facility], Dionne Bucy, MD

## 2019-10-18 ENCOUNTER — Other Ambulatory Visit: Payer: Self-pay | Admitting: Family Medicine

## 2019-10-18 DIAGNOSIS — R232 Flushing: Secondary | ICD-10-CM

## 2019-10-18 NOTE — Telephone Encounter (Signed)
LOV  06/01/19  LRF  10/17/18  Dr Glennon Mac?

## 2019-10-18 NOTE — Telephone Encounter (Signed)
Requested Prescriptions  Pending Prescriptions Disp Refills  . lisinopril (ZESTRIL) 10 MG tablet [Pharmacy Med Name: LISINOPRIL 10 MG TABS 10 Tablet] 90 tablet 0    Sig: TAKE 1 TABLET BY MOUTH DAILY.     Cardiovascular:  ACE Inhibitors Passed - 10/18/2019  9:12 AM      Passed - Cr in normal range and within 180 days    Creatinine, Ser  Date Value Ref Range Status  05/31/2019 0.58 0.57 - 1.00 mg/dL Final         Passed - K in normal range and within 180 days    Potassium  Date Value Ref Range Status  05/31/2019 4.1 3.5 - 5.2 mmol/L Final         Passed - Patient is not pregnant      Passed - Last BP in normal range    BP Readings from Last 1 Encounters:  05/31/19 110/74         Passed - Valid encounter within last 6 months    Recent Outpatient Visits          4 months ago Type 2 diabetes mellitus without complication, with long-term current use of insulin ([place])   [facility] [place], Dionne Bucy, MD   10 months ago Encounter for annual physical exam   [facility] [place], Dionne Bucy, MD   1 year ago Type 2 diabetes mellitus without complication, with long-term current use of insulin [facility])   [place], Dionne Bucy, MD   1 year ago Type 2 diabetes mellitus without complication, with long-term current use of insulin [facility])   [facility] [place], Dionne Bucy, MD   1 year ago Type 2 diabetes mellitus without complication, with long-term current use of insulin [facility])   [facility], Dionne Bucy, MD      Future Appointments            In 1 week Brendolyn Patty, MD [place]           . progesterone (PROMETRIUM) 200 MG capsule [Pharmacy Med Name: PROGESTERONE 200 MG CAPSULE 200 Capsule] 36 capsule 0    Sig: TAKE 1 CAPSULE BY MOUTH DAILY TAKE CALENDAR DAYS 1-12 [facility] MONTH     OB/GYN:  Progestins Passed - 10/18/2019  9:12 AM      Passed - Valid encounter within last 12 months     Recent Outpatient Visits          4 months ago Type 2 diabetes mellitus without complication, with long-term current use of insulin [facility])   [facility] [place], Dionne Bucy, MD   10 months ago Encounter for annual physical exam   [facility] [place], Dionne Bucy, MD   1 year ago Type 2 diabetes mellitus without complication, with long-term current use of insulin [facility])   [facility], Dionne Bucy, MD   1 year ago Type 2 diabetes mellitus without complication, with long-term current use of insulin [facility])   [facility] [place], Dionne Bucy, MD   1 year ago Type 2 diabetes mellitus without complication, with long-term current use of insulin [facility])   [facility], Dionne Bucy, MD      Future Appointments            In 1 week Brendolyn Patty, MD [place]

## 2019-10-18 NOTE — Telephone Encounter (Signed)
Requested medication (s) are due for refill today: yes  Requested medication (s) are on the active medication list: yes  Last refill:  07/26/2019  Future visit scheduled: no  Notes to clinic:  review for refill Last filled by a different provider    Requested Prescriptions  Pending Prescriptions Disp Refills   estradiol (ESTRACE) 1 MG tablet [Pharmacy Med Name: ESTRADIOL 1 MG TABLET 1 Tablet] 90 tablet 6    Sig: TAKE 1 TABLET (1 MG TOTAL) BY MOUTH DAILY.      OB/GYN:  Estrogens Passed - 10/18/2019  9:14 AM      Passed - Mammogram is up-to-date per Health Maintenance      Passed - Last BP in normal range    BP Readings from Last 1 Encounters:  05/31/19 110/74          Passed - Valid encounter within last 12 months    Recent Outpatient Visits           4 months ago Type 2 diabetes mellitus without complication, with long-term current use of insulin [facility])   [facility] [place], Dionne Bucy, MD   10 months ago Encounter for annual physical exam   [facility] [place], Dionne Bucy, MD   1 year ago Type 2 diabetes mellitus without complication, with long-term current use of insulin [facility])   [facility], Dionne Bucy, MD   1 year ago Type 2 diabetes mellitus without complication, with long-term current use of insulin [facility])   [facility] [place], Dionne Bucy, MD   1 year ago Type 2 diabetes mellitus without complication, with long-term current use of insulin [facility])   [facility], Dionne Bucy, MD       Future Appointments             In 1 week Brendolyn Patty, MD [place]

## 2019-10-25 ENCOUNTER — Other Ambulatory Visit: Payer: Self-pay

## 2019-10-25 ENCOUNTER — Ambulatory Visit: Payer: 59 | Admitting: Dermatology

## 2019-10-25 DIAGNOSIS — L659 Nonscarring hair loss, unspecified: Secondary | ICD-10-CM | POA: Diagnosis not present

## 2019-10-25 NOTE — Progress Notes (Signed)
   New Patient Visit  Subjective  Lynn Coleman is a 49 y.o. female who presents for the following: hair loss.  Patient here today for thinning of the hair at bilateral temple hairline. She advises she has had thin hair for years but thinning has accelerated over the past year and a half. She has been using generic Rogaine 2% for 1 week with no noticeable changes. No recent illnesses or surgeries, no changes in medications. She is on hormones for menopause (estrogen/progesterone) which started 2 years ago. Patient's father and brothers have female pattern baldness and her sister has had some mild thinning which improved when she went natural.  Patient does have hair relaxed every 7 weeks for years but does not wear hair pulled back tight or do weaves, she does wrap and pin at night covered with a scarf but does not pull it tight.  Her thyroid was checked by PCP 6 months ago and was WNL. She advises her diet is well balanced and non-restrictive. She eats meat.  No itching or rash, some flaking and dryness in the winter but not bothersome for patient.   The following portions of the chart were reviewed this encounter and updated as appropriate:     Review of Systems:  No other skin or systemic complaints except as noted in HPI or Assessment and Plan.  Objective  Well appearing patient in no apparent distress; mood and affect are within normal limits.  A focused examination was performed including scalp. Relevant physical exam findings are noted in the Assessment and Plan.  Objective  Scalp: Frontal temporal hairline thinning with some shortened hairs.  Also occipital hairline.  Rest of scalp appears normal.   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 Patient advised hair thinning could be hereditary or it could possibly start to recover and regrow if she were to go natural and avoid  chemical relaxers/heat. Recommend she continue Rogaine daily, increasing to the 5% foam.  Advised there could be increased hair growth at face.  Previous labs reviewed and all WNL, including TSH, Hb.  Discussed spironolactone PO, red light hair wand/caps for regrowth. Revian coupon given  Return in about 3 months (around 01/25/2020) for hair loss.   Graciella Belton, RMA, am acting as scribe for Brendolyn Patty, MD .  Documentation: I have reviewed the above documentation for accuracy and completeness, and I agree with the above.  Brendolyn Patty, MD

## 2019-12-13 ENCOUNTER — Ambulatory Visit (INDEPENDENT_AMBULATORY_CARE_PROVIDER_SITE_OTHER): Payer: 59 | Admitting: Family Medicine

## 2019-12-13 ENCOUNTER — Encounter: Payer: Self-pay | Admitting: Family Medicine

## 2019-12-13 ENCOUNTER — Other Ambulatory Visit: Payer: Self-pay

## 2019-12-13 ENCOUNTER — Other Ambulatory Visit (HOSPITAL_COMMUNITY)
Admission: RE | Admit: 2019-12-13 | Discharge: 2019-12-13 | Disposition: A | Discharge status: Home / Self Care | Payer: 59 | Source: Ambulatory Visit | Attending: Family Medicine | Admitting: Family Medicine

## 2019-12-13 VITALS — BP 128/80 | HR 77 | Temp 96.2°F | Resp 16 | Ht 67.0 in | Wt 124.4 lb

## 2019-12-13 DIAGNOSIS — R232 Flushing: Secondary | ICD-10-CM | POA: Diagnosis not present

## 2019-12-13 DIAGNOSIS — Z1231 Encounter for screening mammogram for malignant neoplasm of breast: Secondary | ICD-10-CM | POA: Diagnosis not present

## 2019-12-13 DIAGNOSIS — Z114 Encounter for screening for human immunodeficiency virus [HIV]: Secondary | ICD-10-CM | POA: Diagnosis not present

## 2019-12-13 DIAGNOSIS — Z0000 Encounter for general adult medical examination without abnormal findings: Secondary | ICD-10-CM | POA: Diagnosis not present

## 2019-12-13 DIAGNOSIS — E7800 Pure hypercholesterolemia, unspecified: Secondary | ICD-10-CM | POA: Diagnosis not present

## 2019-12-13 DIAGNOSIS — Z124 Encounter for screening for malignant neoplasm of cervix: Secondary | ICD-10-CM | POA: Diagnosis not present

## 2019-12-13 DIAGNOSIS — Z1159 Encounter for screening for other viral diseases: Secondary | ICD-10-CM | POA: Diagnosis not present

## 2019-12-13 DIAGNOSIS — E119 Type 2 diabetes mellitus without complications: Secondary | ICD-10-CM

## 2019-12-13 DIAGNOSIS — Z794 Long term (current) use of insulin: Secondary | ICD-10-CM | POA: Diagnosis not present

## 2019-12-13 NOTE — Assessment & Plan Note (Signed)
Well controlled Continue current meds Did not tolerate higher dose of Trulicity due to weight los and hair loss Foot exam today On ACEi and statin UTD on screenings and vaccines Recheck A1c F/u in 6 months

## 2019-12-13 NOTE — Assessment & Plan Note (Signed)
Previously well controlled Continue statin Repeat FLP and CMP Goal LDL < 70 

## 2019-12-13 NOTE — Assessment & Plan Note (Signed)
Chronic and stable Stopped HRT No longer on gabapentin Continue to monitor

## 2019-12-13 NOTE — Progress Notes (Signed)
Complete physical exam   Patient: Lynn Coleman   DOB: 21-Aug-1970   49 y.o. Female  MRN: [id number] Visit Date: 12/13/2019  Today's healthcare provider: Lavon Paganini, MD  Cameron Ali as a scribe for Lavon Paganini, MD.,have documented all relevant documentation on the behalf of Lavon Paganini, MD,as directed by  Lavon Paganini, MD while in the presence of Lavon Paganini, MD. Chief Complaint  Patient presents with  . Annual Exam   Subjective    Lynn Coleman is a 49 y.o. female who presents today for a complete physical exam.  She reports consuming a general diet. The patient does not participate in regular exercise at present. She generally feels well. She reports sleeping fairly well, patient reports that she has been having hot flashes. She does not have additional problems to discuss today.  HPI   Past Medical History:  Diagnosis Date  . Allergic rhinitis   . Anxiety   . Diabetes mellitus without complication ([place])   . History of chicken pox   . Hyperlipidemia    Past Surgical History:  Procedure Laterality Date  . PLEURAL SCARIFICATION     Social History   Socioeconomic History  . Marital status: Married    Spouse name: Darrell  . Number of children: 0  . Years of education: Not on file  . Highest education level: Doctorate  Occupational History    Employer: [employer]  Tobacco Use  . Smoking status: Never Smoker  . Smokeless tobacco: Never Used  Vaping Use  . Vaping Use: Never used  Substance and Sexual Activity  . Alcohol use: Yes    Comment: occasional. 1 glass of wine 2-3 times per month  . Drug use: No  . Sexual activity: Yes    Partners: Male    Birth control/protection: Patch  Other Topics Concern  . Not on file  Social History Narrative  . Not on file   Social Determinants of Health   Financial Resource Strain:   . Difficulty of Paying Living Expenses:   Food Insecurity:   . Worried About Paediatric nurse in the Last Year:   . Arboriculturist in the Last Year:   Transportation Needs:   . Film/video editor (Medical):   Marland Kitchen Lack of Transportation (Non-Medical):   Physical Activity:   . Days of Exercise per Week:   . Minutes of Exercise per Session:   Stress:   . Feeling of Stress :   Social Connections:   . Frequency of Communication with Friends and Family:   . Frequency of Social Gatherings with Friends and Family:   . Attends Religious Services:   . Active Member of Clubs or Organizations:   . Attends Archivist Meetings:   Marland Kitchen Marital Status:   Intimate Partner Violence:   . Fear of Current or Ex-Partner:   . Emotionally Abused:   Marland Kitchen Physically Abused:   . Sexually Abused:    Family Status  Relation Name Status  . Mother  Deceased  . Father  Deceased  . Sister  Alive  . Brother  Deceased  . MGM  (Not Specified)  . MGF  (Not Specified)  . PGM  (Not Specified)  . Neg Hx  (Not Specified)   Family History  Problem Relation Age of Onset  . Arthritis Mother   . Heart disease Mother   . Hypertension Mother   . Hyperlipidemia Mother   . Dementia Mother   .  Hypertension Father   . Lung cancer Father        smoker  . Hypertension Sister   . Glaucoma Brother   . Pancreatic cancer Brother   . Asthma Maternal Grandmother   . Stomach cancer Maternal Grandfather   . Gastric cancer Maternal Grandfather   . Arthritis Paternal Grandmother   . Breast cancer Neg Hx   . Colon cancer Neg Hx    No Known Allergies  Patient Care Team: Virginia Crews, MD as PCP - General (Family Medicine)   Medications: Outpatient Medications Prior to Visit  Medication Sig  . atorvastatin (LIPITOR) 40 MG tablet Take 1 tablet (40 mg total) by mouth daily.  . Blood Glucose Monitoring Suppl (FREESTYLE LITE) DEVI Use to check blood glucose once daily  . cetirizine (ZYRTEC) 10 MG tablet Take 10 mg by mouth daily.  . clonazePAM (KLONOPIN) 0.5 MG tablet Take 1 tablet (0.5  mg total) by mouth 2 (two) times daily as needed.  Marland Kitchen glucose blood (FREESTYLE LITE) test strip Use as instructed to check blood glucose once daily  . glucose blood (ONE TOUCH ULTRA TEST) test strip Use 2 (two) times daily.  . Lancets (FREESTYLE) lancets Use as instructed  . LANTUS SOLOSTAR 100 UNIT/ML Solostar Pen Inject 11-12 Units into the skin daily at 10 pm.  . lisinopril (ZESTRIL) 10 MG tablet TAKE 1 TABLET BY MOUTH DAILY.  . metFORMIN (GLUCOPHAGE) 1000 MG tablet TAKE 1 TABLET BY MOUTH 2 TIMES DAILY WITH A MEAL.  . Multiple Vitamin (MULTIVITAMIN) tablet Take 1 tablet by mouth daily.  Marland Kitchen triamcinolone ointment (KENALOG) 0.5 % Apply 1 application topically 2 (two) times daily.  . TRULICITY 5.28 UX/3.2GM SOPN INJECT 0.75 MG INTO THE SKIN ONCE A WEEK.  . [DISCONTINUED] cholecalciferol (VITAMIN D) 400 units TABS tablet Take 400 Units by mouth.  . [DISCONTINUED] escitalopram (LEXAPRO) 10 MG tablet Take 1/2 tablet a day for 14 days then increase to 1 tablet a daily.  . [DISCONTINUED] estradiol (ESTRACE) 1 MG tablet TAKE 1 TABLET (1 MG TOTAL) BY MOUTH DAILY. (Patient not taking: Reported on 12/13/2019)  . [DISCONTINUED] gabapentin (NEURONTIN) 100 MG capsule Take 2 capsules (200 mg total) by mouth at bedtime.  . [DISCONTINUED] progesterone (PROMETRIUM) 200 MG capsule TAKE 1 CAPSULE BY MOUTH DAILY TAKE CALENDAR DAYS 1-12 [facility] MONTH (Patient not taking: Reported on 12/13/2019)   No facility-administered medications prior to visit.    Review of Systems  Endocrine: Positive for heat intolerance.  All other systems reviewed and are negative.    Objective    BP 128/80   Pulse 77   Temp (!) 96.2 F (35.7 C) (Oral)   Resp 16   Ht 5' 7"  (1.702 m)   Wt 124 lb 6.4 oz (56.4 kg)   SpO2 99%   BMI 19.48 kg/m  Wt Readings from Last 3 Encounters:  12/13/19 124 lb 6.4 oz (56.4 kg)  05/31/19 131 lb (59.4 kg)  11/30/18 134 lb (60.8 kg)      Physical Exam Vitals reviewed. Exam conducted with a  chaperone present.  Constitutional:      General: She is not in acute distress.    Appearance: Normal appearance. She is well-developed and normal weight. She is not diaphoretic.  HENT:     Head: Normocephalic and atraumatic.     Right Ear: Tympanic membrane, ear canal and external ear normal.     Left Ear: Tympanic membrane, ear canal and external ear normal.     Nose:  Nose normal.     Mouth/Throat:     Mouth: Mucous membranes are moist.     Pharynx: Oropharynx is clear. No oropharyngeal exudate.  Eyes:     General: No scleral icterus.    Extraocular Movements: Extraocular movements intact.     Conjunctiva/sclera: Conjunctivae normal.     Pupils: Pupils are equal, round, and reactive to light.  Neck:     Thyroid: No thyromegaly.  Cardiovascular:     Rate and Rhythm: Normal rate and regular rhythm.     Pulses: Normal pulses.     Heart sounds: Normal heart sounds. No murmur heard.   Pulmonary:     Effort: Pulmonary effort is normal. No respiratory distress.     Breath sounds: Normal breath sounds. No stridor. No wheezing, rhonchi or rales.  Chest:     Breasts: Tanner Score is 5. Breasts are symmetrical.        Right: Normal. No inverted nipple, nipple discharge, skin change or tenderness.        Left: Normal. No inverted nipple, nipple discharge, skin change or tenderness.  Abdominal:     General: Abdomen is flat. There is no distension.     Palpations: Abdomen is soft.     Tenderness: There is no abdominal tenderness.  Genitourinary:    Exam position: Lithotomy position.     Pubic Area: No rash or pubic lice.      Tanner stage (genital): 5.     Labia:        Right: No rash or tenderness.        Left: No rash or tenderness.      Urethra: No prolapse, urethral pain, urethral swelling or urethral lesion.     Vagina: Normal.     Cervix: Normal.     Uterus: Normal.      Adnexa: Right adnexa normal.     Rectum: No external hemorrhoid.     Comments: GYN:  External genitalia  within normal limits.  Vaginal mucosa pink, moist, normal rugae.  Nonfriable cervix without lesions, no discharge or bleeding noted on speculum exam.  Bimanual exam revealed normal, nongravid uterus.  No cervical motion tenderness. No adnexal masses bilaterally.    Musculoskeletal:        General: No deformity. Normal range of motion.     Cervical back: Normal range of motion and neck supple.     Right lower leg: No edema.     Left lower leg: No edema.  Lymphadenopathy:     Cervical: No cervical adenopathy.     Upper Body:     Right upper body: No supraclavicular, axillary or pectoral adenopathy.     Left upper body: No supraclavicular, axillary or pectoral adenopathy.  Skin:    General: Skin is warm and dry.     Findings: No rash.  Neurological:     Mental Status: She is alert and oriented to person, place, and time. Mental status is at baseline.     Sensory: No sensory deficit.     Motor: No weakness.     Gait: Gait normal.  Psychiatric:        Attention and Perception: Attention normal.        Mood and Affect: Mood normal.        Speech: Speech normal.        Behavior: Behavior normal. Behavior is cooperative.        Thought Content: Thought content normal.        Cognition  and Memory: Cognition normal.        Judgment: Judgment normal.     Diabetic Foot Exam - Simple   Simple Foot Form Diabetic Foot exam was performed with the following findings: Yes 12/13/2019  4:02 PM  Visual Inspection No deformities, no ulcerations, no other skin breakdown bilaterally: Yes Sensation Testing Intact to touch and monofilament testing bilaterally: Yes Pulse Check Posterior Tibialis and Dorsalis pulse intact bilaterally: Yes Comments      Last depression screening scores PHQ 2/9 Scores 12/13/2019 11/30/2018 10/06/2017  PHQ - 2 Score 0 0 0  PHQ- 9 Score 2 3 -   Last fall risk screening Fall Risk  12/13/2019  Falls in the past year? 0  Number falls in past yr: 0  Injury with Fall? 0   Follow up -   Last Audit-C alcohol use screening Alcohol Use Disorder Test (AUDIT) 12/13/2019  1. How often do you have a drink containing alcohol? 2  2. How many drinks containing alcohol do you have on a typical day when you are drinking? 0  3. How often do you have six or more drinks on one occasion? 0  AUDIT-C Score 2   A score of 3 or more in women, and 4 or more in men indicates increased risk for alcohol abuse, EXCEPT if all of the points are from question 1   No results found for any visits on 12/13/19.  Assessment & Plan     Routine Health Maintenance and Physical Exam  Exercise Activities and Dietary recommendations Goals   None     Immunization History  Administered Date(s) Administered  . IPV 10/26/1973, 11/23/1973, 12/21/1973, 08/09/1991  . Influenza,inj,Quad PF,6+ Mos 03/17/2017  . Influenza-Unspecified 03/22/2014, 03/22/2016, 03/21/2018  . MMR 07/04/1985, 08/09/1991, 02/07/1992  . Pneumococcal Polysaccharide-23 04/02/2014  . Tdap 12/07/2011    Health Maintenance  Topic Date Due  . Hepatitis C Screening  Never done  . COVID-19 Vaccine (1) Never done  . HIV Screening  Never done  . PAP SMEAR-Modifier  10/16/2019  . HEMOGLOBIN A1C  11/29/2019  . INFLUENZA VACCINE  01/21/2020  . OPHTHALMOLOGY EXAM  01/25/2020  . FOOT EXAM  12/12/2020  . MAMMOGRAM  05/16/2021  . TETANUS/TDAP  12/06/2021  . Fecal DNA (Cologuard)  02/03/2022  . PNEUMOCOCCAL POLYSACCHARIDE VACCINE AGE 26-64 HIGH RISK  Completed    Discussed health benefits of physical activity, and encouraged her to engage in regular exercise appropriate for her age and condition.  Problem List Items Addressed This Visit      Cardiovascular and Mediastinum   Hot flashes    Chronic and stable Stopped HRT No longer on gabapentin Continue to monitor      Relevant Orders   Comprehensive metabolic panel     Endocrine   Type 2 diabetes mellitus without complication, with long-term current use of  insulin (HCC)    Well controlled Continue current meds Did not tolerate higher dose of Trulicity due to weight los and hair loss Foot exam today On ACEi and statin UTD on screenings and vaccines Recheck A1c F/u in 6 months      Relevant Orders   Hemoglobin A1c     Other   HLD (hyperlipidemia)    Previously well controlled Continue statin Repeat FLP and CMP Goal LDL < 70      Relevant Orders   Lipid panel    Other Visit Diagnoses    Annual physical exam    -  Primary   Relevant  Orders   Comprehensive metabolic panel   Lipid panel   Hemoglobin A1c   Encounter for screening mammogram for malignant neoplasm of breast       Relevant Orders   MM DIGITAL SCREENING BILATERAL   Need for hepatitis C screening test       Relevant Orders   Hepatitis C Antibody   Screening for HIV (human immunodeficiency virus)       Relevant Orders   HIV antibody (with reflex)   Screening for cervical cancer       Relevant Orders   Cytology - PAP       Return if symptoms worsen or fail to improve.     I, Lavon Paganini, MD, have reviewed all documentation for this visit. The documentation on 12/13/19 for the exam, diagnosis, procedures, and orders are all accurate and complete.   Maritssa Haughton, Dionne Bucy, MD, MPH [facility]

## 2019-12-13 NOTE — Patient Instructions (Addendum)
Health Maintenance, Female Adopting a healthy lifestyle and getting preventive care are important in promoting health and wellness. Ask your health care provider about:  The right schedule for you to have regular tests and exams.  Things you can do on your own to prevent diseases and keep yourself healthy. What should I know about diet, weight, and exercise? Eat a healthy diet   Eat a diet that includes plenty of vegetables, fruits, low-fat dairy products, and lean protein.  Do not eat a lot of foods that are high in solid fats, added sugars, or sodium. Maintain a healthy weight Body mass index (BMI) is used to identify weight problems. It estimates body fat based on height and weight. Your health care provider can help determine your BMI and help you achieve or maintain a healthy weight. Get regular exercise Get regular exercise. This is one of the most important things you can do for your health. Most adults should:  Exercise for at least 150 minutes each week. The exercise should increase your heart rate and make you sweat (moderate-intensity exercise).  Do strengthening exercises at least twice a week. This is in addition to the moderate-intensity exercise.  Spend less time sitting. Even light physical activity can be beneficial. Watch cholesterol and blood lipids Have your blood tested for lipids and cholesterol at 50 years of age, then have this test every 5 years. Have your cholesterol levels checked more often if:  Your lipid or cholesterol levels are high.  You are older than 50 years of age.  You are at high risk for heart disease. What should I know about cancer screening? Depending on your health history and family history, you may need to have cancer screening at various ages. This may include screening for:  Breast cancer.  Cervical cancer.  Colorectal cancer.  Skin cancer.  Lung cancer. What should I know about heart disease, diabetes, and high blood  pressure? Blood pressure and heart disease  High blood pressure causes heart disease and increases the risk of stroke. This is more likely to develop in people who have high blood pressure readings, are of African descent, or are overweight.  Have your blood pressure checked: ? Every 3-5 years if you are 18-39 years of age. ? Every year if you are 40 years old or older. Diabetes Have regular diabetes screenings. This checks your fasting blood sugar level. Have the screening done:  Once every three years after age 40 if you are at a normal weight and have a low risk for diabetes.  More often and at a younger age if you are overweight or have a high risk for diabetes. What should I know about preventing infection? Hepatitis B If you have a higher risk for hepatitis B, you should be screened for this virus. Talk with your health care provider to find out if you are at risk for hepatitis B infection. Hepatitis C Testing is recommended for:  Everyone born from 1945 through 1965.  Anyone with known risk factors for hepatitis C. Sexually transmitted infections (STIs)  Get screened for STIs, including gonorrhea and chlamydia, if: ? You are sexually active and are younger than 50 years of age. ? You are older than 50 years of age and your health care provider tells you that you are at risk for this type of infection. ? Your sexual activity has changed since you were last screened, and you are at increased risk for chlamydia or gonorrhea. Ask your health care provider if   you are at risk.  Ask your health care provider about whether you are at high risk for HIV. Your health care provider may recommend a prescription medicine to help prevent HIV infection. If you choose to take medicine to prevent HIV, you should first get tested for HIV. You should then be tested every 3 months for as long as you are taking the medicine. Pregnancy  If you are about to stop having your period (premenopausal) and  you may become pregnant, seek counseling before you get pregnant.  Take 400 to 800 micrograms (mcg) of folic acid every day if you become pregnant.  Ask for birth control (contraception) if you want to prevent pregnancy. Osteoporosis and menopause Osteoporosis is a disease in which the bones lose minerals and strength with aging. This can result in bone fractures. If you are 85 years old or older, or if you are at risk for osteoporosis and fractures, ask your health care provider if you should:  Be screened for bone loss.  Take a calcium or vitamin D supplement to lower your risk of fractures.  Be given hormone replacement therapy (HRT) to treat symptoms of menopause. Follow these instructions at home: Lifestyle  Do not use any products that contain nicotine or tobacco, such as cigarettes, e-cigarettes, and chewing tobacco. If you need help quitting, ask your health care provider.  Do not use street drugs.  Do not share needles.  Ask your health care provider for help if you need support or information about quitting drugs. Alcohol use  Do not drink alcohol if: ? Your health care provider tells you not to drink. ? You are pregnant, may be pregnant, or are planning to become pregnant.  If you drink alcohol: ? Limit how much you use to 0-1 drink a day. ? Limit intake if you are breastfeeding.  Be aware of how much alcohol is in your drink. In the [place] one drink equals one 12 oz bottle of beer (355 mL), one 5 oz glass of wine (148 mL), or one 1 oz glass of hard liquor (44 mL). General instructions  Schedule regular health, dental, and eye exams.  Stay current with your vaccines.  Tell your health care provider if: ? You often feel depressed. ? You have ever been abused or do not feel safe at home. Summary  Adopting a healthy lifestyle and getting preventive care are important in promoting health and wellness.  Follow your health care provider's instructions about healthy  diet, exercising, and getting tested or screened for diseases.  Follow your health care provider's instructions on monitoring your cholesterol and blood pressure. This information is not intended to replace advice given to you by your health care provider. Make sure you discuss any questions you have with your health care provider. Document Revised: 06/01/2018 Document Reviewed: 06/01/2018 Elsevier Patient Education  2020 [place] Breast self-awareness is knowing how your breasts look and feel. Doing breast self-awareness is important. It allows you to catch a breast problem early while it is still small and can be treated. All women should do breast self-awareness, including women who have had breast implants. Tell your doctor if you notice a change in your breasts. What you need:  A mirror.  A well-lit room. How to do a breast self-exam A breast self-exam is one way to learn what is normal for your breasts and to check for changes. To do a breast self-exam: Look for changes  1. Take off all  the clothes above your waist. 2. Stand in front of a mirror in a room with good lighting. 3. Put your hands on your hips. 4. Push your hands down. 5. Look at your breasts and nipples in the mirror to see if one breast or nipple looks different from the other. Check to see if: ? The shape of one breast is different. ? The size of one breast is different. ? There are wrinkles, dips, and bumps in one breast and not the other. 6. Look at each breast for changes in the skin, such as: ? Redness. ? Scaly areas. 7. Look for changes in your nipples, such as: ? Liquid around the nipples. ? Bleeding. ? Dimpling. ? Redness. ? A change in where the nipples are. Feel for changes  1. Lie on your back on the floor. 2. Feel each breast. To do this, follow these steps: ? Pick a breast to feel. ? Put the arm closest to that breast above your head. ? Use your other arm to feel  the nipple area of your breast. Feel the area with the pads of your three middle fingers by making small circles with your fingers. For the first circle, press lightly. For the second circle, press harder. For the third circle, press even harder. ? Keep making circles with your fingers at the different pressures as you move down your breast. Stop when you feel your ribs. ? Move your fingers a little toward the center of your body. ? Start making circles with your fingers again, this time going up until you reach your collarbone. ? Keep making up-and-down circles until you reach your armpit. Remember to keep using the three pressures. ? Feel the other breast in the same way. 3. Sit or stand in the tub or shower. 4. With soapy water on your skin, feel each breast the same way you did in step 2 when you were lying on the floor. Write down what you find Writing down what you find can help you remember what to tell your doctor. Write down:  What is normal for each breast.  Any changes you find in each breast, including: ? The kind of changes you find. ? Whether you have pain. ? Size and location of any lumps.  When you last had your menstrual period. General tips  Check your breasts every month.  If you are breastfeeding, the best time to check your breasts is after you feed your baby or after you use a breast pump.  If you get menstrual periods, the best time to check your breasts is 5-7 days after your menstrual period is over.  With time, you will become comfortable with the self-exam, and you will begin to know if there are changes in your breasts. Contact a doctor if you:  See a change in the shape or size of your breasts or nipples.  See a change in the skin of your breast or nipples, such as red or scaly skin.  Have fluid coming from your nipples that is not normal.  Find a lump or thick area that was not there before.  Have pain in your breasts.  Have any concerns about  your breast health. Summary  Breast self-awareness includes looking for changes in your breasts, as well as feeling for changes within your breasts.  Breast self-awareness should be done in front of a mirror in a well-lit room.  You should check your breasts every month. If you get menstrual periods, the best  time to check your breasts is 5-7 days after your menstrual period is over.  Let your doctor know of any changes you see in your breasts, including changes in size, changes on the skin, pain or tenderness, or fluid from your nipples that is not normal. This information is not intended to replace advice given to you by your health care provider. Make sure you discuss any questions you have with your health care provider. Document Revised: 01/25/2018 Document Reviewed: 01/25/2018 Elsevier Patient Education  2020 [place].   Menopause Menopause is the normal time of life when menstrual periods stop completely. It is usually confirmed by 12 months without a menstrual period. The transition to menopause (perimenopause) most often happens between the ages of 37 and 53. During perimenopause, hormone levels change in your body, which can cause symptoms and affect your health. Menopause may increase your risk for:  Loss of bone (osteoporosis), which causes bone breaks (fractures).  Depression.  Hardening and narrowing of the arteries (atherosclerosis), which can cause heart attacks and strokes. What are the causes? This condition is usually caused by a natural change in hormone levels that happens as you get older. The condition may also be caused by surgery to remove both ovaries (bilateral oophorectomy). What increases the risk? This condition is more likely to start at an earlier age if you have certain medical conditions or treatments, including:  A tumor of the pituitary gland in the brain.  A disease that affects the ovaries and hormone production.  Radiation treatment for  cancer.  Certain cancer treatments, such as chemotherapy or hormone (anti-estrogen) therapy.  Heavy smoking and excessive alcohol use.  Family history of early menopause. This condition is also more likely to develop earlier in women who are very thin. What are the signs or symptoms? Symptoms of this condition include:  Hot flashes.  Irregular menstrual periods.  Night sweats.  Changes in feelings about sex. This could be a decrease in sex drive or an increased comfort around your sexuality.  Vaginal dryness and thinning of the vaginal walls. This may cause painful intercourse.  Dryness of the skin and development of wrinkles.  Headaches.  Problems sleeping (insomnia).  Mood swings or irritability.  Memory problems.  Weight gain.  Hair growth on the face and chest.  Bladder infections or problems with urinating. How is this diagnosed? This condition is diagnosed based on your medical history, a physical exam, your age, your menstrual history, and your symptoms. Hormone tests may also be done. How is this treated? In some cases, no treatment is needed. You and your health care provider should make a decision together about whether treatment is necessary. Treatment will be based on your individual condition and preferences. Treatment for this condition focuses on managing symptoms. Treatment may include:  Menopausal hormone therapy (MHT).  Medicines to treat specific symptoms or complications.  Acupuncture.  Vitamin or herbal supplements. Before starting treatment, make sure to let your health care provider know if you have a personal or family history of:  Heart disease.  Breast cancer.  Blood clots.  Diabetes.  Osteoporosis. Follow these instructions at home: Lifestyle  Do not use any products that contain nicotine or tobacco, such as cigarettes and e-cigarettes. If you need help quitting, ask your health care provider.  Get at least 30 minutes of  physical activity on 5 or more days each week.  Avoid alcoholic and caffeinated beverages, as well as spicy foods. This may help prevent hot flashes.  Get 7-8 hours of sleep each night.  If you have hot flashes, try: ? Dressing in layers. ? Avoiding things that may trigger hot flashes, such as spicy food, warm places, or stress. ? Taking slow, deep breaths when a hot flash starts. ? Keeping a fan in your home and office.  Find ways to manage stress, such as deep breathing, meditation, or journaling.  Consider going to group therapy with other women who are having menopause symptoms. Ask your health care provider about recommended group therapy meetings. Eating and drinking  Eat a healthy, balanced diet that contains whole grains, lean protein, low-fat dairy, and plenty of fruits and vegetables.  Your health care provider may recommend adding more soy to your diet. Foods that contain soy include tofu, tempeh, and soy milk.  Eat plenty of foods that contain calcium and vitamin D for bone health. Items that are rich in calcium include low-fat milk, yogurt, beans, almonds, sardines, broccoli, and kale. Medicines  Take over-the-counter and prescription medicines only as told by your health care provider.  Talk with your health care provider before starting any herbal supplements. If prescribed, take vitamins and supplements as told by your health care provider. These may include: ? Calcium. Women age 28 and older should get 1,200 mg (milligrams) of calcium every day. ? Vitamin D. Women need 600-800 International Units of vitamin D each day. ? Vitamins B12 and B6. Aim for 50 micrograms of B12 and 1.5 mg of B6 each day. General instructions  Keep track of your menstrual periods, including: ? When they occur. ? How heavy they are and how long they last. ? How much time passes between periods.  Keep track of your symptoms, noting when they start, how often you have them, and how long they  last.  Use vaginal lubricants or moisturizers to help with vaginal dryness and improve comfort during sex.  Keep all follow-up visits as told by your health care provider. This is important. This includes any group therapy or counseling. Contact a health care provider if:  You are still having menstrual periods after age 33.  You have pain during sex.  You have not had a period for 12 months and you develop vaginal bleeding. Get help right away if:  You have: ? Severe depression. ? Excessive vaginal bleeding. ? Pain when you urinate. ? A fast or irregular heart beat (palpitations). ? Severe headaches. ? Abdomen (abdominal) pain or severe indigestion.  You fell and you think you have a broken bone.  You develop leg or chest pain.  You develop vision problems.  You feel a lump in your breast. Summary  Menopause is the normal time of life when menstrual periods stop completely. It is usually confirmed by 12 months without a menstrual period.  The transition to menopause (perimenopause) most often happens between the ages of 53 and 50.  Symptoms can be managed through medicines, lifestyle changes, and complementary therapies such as acupuncture.  Eat a balanced diet that is rich in nutrients to promote bone health and heart health and to manage symptoms during menopause. This information is not intended to replace advice given to you by your health care provider. Make sure you discuss any questions you have with your health care provider. Document Revised: 05/21/2017 Document Reviewed: 07/11/2016 Elsevier Patient Education  2020 [employer].

## 2019-12-14 ENCOUNTER — Telehealth: Payer: Self-pay

## 2019-12-14 LAB — LIPID PANEL
Chol/HDL Ratio: 2.9 ratio (ref 0.0–4.4)
Cholesterol, Total: 156 mg/dL (ref 100–199)
HDL: 53 mg/dL (ref >39)
LDL Chol Calc (NIH): 95 mg/dL (ref 0–99)
Triglycerides: 34 mg/dL (ref 0–149)
VLDL Cholesterol Cal: 8 mg/dL (ref 5–40)

## 2019-12-14 LAB — HEMOGLOBIN A1C
Est. average glucose Bld gHb Est-mCnc: 203 mg/dL
Hgb A1c MFr Bld: 8.7 % — ABNORMAL HIGH (ref 4.8–5.6)

## 2019-12-14 LAB — COMPREHENSIVE METABOLIC PANEL
ALT: 16 IU/L (ref 0–32)
AST: 17 IU/L (ref 0–40)
Albumin/Globulin Ratio: 1.5 (ref 1.2–2.2)
Albumin: 4.6 g/dL (ref 3.8–4.8)
Alkaline Phosphatase: 71 IU/L (ref 48–121)
BUN/Creatinine Ratio: 18 (ref 9–23)
BUN: 13 mg/dL (ref 6–24)
Bilirubin Total: 0.3 mg/dL (ref 0.0–1.2)
CO2: 28 mmol/L (ref 20–29)
Calcium: 9.9 mg/dL (ref 8.7–10.2)
Chloride: 97 mmol/L (ref 96–106)
Creatinine, Ser: 0.71 mg/dL (ref 0.57–1.00)
GFR calc Af Amer: 115 mL/min/{1.73_m2} (ref >59)
GFR calc non Af Amer: 100 mL/min/{1.73_m2} (ref >59)
Globulin, Total: 3.1 g/dL (ref 1.5–4.5)
Glucose: 71 mg/dL (ref 65–99)
Potassium: 4.2 mmol/L (ref 3.5–5.2)
Sodium: 138 mmol/L (ref 134–144)
Total Protein: 7.7 g/dL (ref 6.0–8.5)

## 2019-12-14 LAB — HIV ANTIBODY (ROUTINE TESTING W REFLEX): HIV Screen 4th Generation wRfx: NONREACTIVE

## 2019-12-14 LAB — HEPATITIS C ANTIBODY: Hep C Virus Ab: <0.1 s/co ratio (ref 0.0–0.9)

## 2019-12-14 NOTE — Telephone Encounter (Signed)
LMTCB, PEC may give result.  

## 2019-12-14 NOTE — Telephone Encounter (Signed)
-----   Message from Virginia Crews, MD sent at 12/14/2019  8:23 AM EDT ----- Normal kidney function, liver function, electrolytes.  Negative HIV and hepatitis C screening.  Cholesterol has increased since it was last checked and is no longer goal for diabetes.  A1c has also increased significantly to 8.7.  Lets make sure that she is taking her medications regularly.  Wondering if changing her diet when she was visiting family recently may have contributed to these changes.  We could change her medications, or we could let her get back on her low-carb and low-fat diet that she eats typically at home and recheck these in 3 months.

## 2019-12-14 NOTE — Telephone Encounter (Signed)
I agree with patient's plan.  We'll just need to set her up for 3 m f/u.

## 2019-12-14 NOTE — Telephone Encounter (Signed)
Pt given results per Dr Brita Romp, "Normal kidney function, liver function, electrolytes.  Negative HIV and hepatitis C screening.  Cholesterol has increased since it was last checked and is no longer goal for diabetes.  A1c has also increased significantly to 8.7.  Lets make sure that she is taking her medications regularly.  Wondering if changing her diet when she was visiting family recently may have contributed to these changes.  We could change her medications, or we could let her get back on her low-carb and low-fat diet that she eats typically at home and recheck these in 3 months'; she verbalized understanding; the pt says she said she only had her metformin for once daily dosing instead of twice daily; she would like to resume her low fat/carb diet and Metformin as ordered, and follow up in 3 months; will route to office for notification.

## 2019-12-15 NOTE — Telephone Encounter (Signed)
LMTCB 12/15/2019.  PEC please schedule pt a three month follow up when she calls back.   Thanks,   -Mickel Baas

## 2019-12-18 LAB — CYTOLOGY - PAP
Comment: NEGATIVE
Diagnosis: NEGATIVE
High risk HPV: NEGATIVE

## 2019-12-19 ENCOUNTER — Telehealth: Payer: Self-pay

## 2019-12-19 NOTE — Telephone Encounter (Signed)
LMTCB-if patient calls back ok for [facility] nurse to give results

## 2019-12-19 NOTE — Telephone Encounter (Signed)
-----   Message from Mar Daring, [place] sent at 12/19/2019 11:56 AM EDT ----- Pap is normal, HPV negative.  Will repeat in 5 years.

## 2019-12-20 NOTE — Telephone Encounter (Signed)
Phone call to pt.  Advised of result note recommendations, per Fenton Malling, re: Pap, and HPV test.  Pt. Verb. Understanding.

## 2020-01-04 ENCOUNTER — Telehealth: Payer: Self-pay

## 2020-01-04 NOTE — Telephone Encounter (Signed)
Copied from [place] [phone]. Topic: General - Other >> Jan 04, 2020  8:01 AM Hinda Lenis D wrote: Reason for CRM: PT has some medical concerns and would to speak with a nurse about couple programs

## 2020-01-05 NOTE — Telephone Encounter (Signed)
Left message to call back  

## 2020-01-08 NOTE — Telephone Encounter (Signed)
FYI. Thanks!

## 2020-01-08 NOTE — Telephone Encounter (Signed)
Patient called back to let provider know that she has requested a Dexacom to have her blood sugars checked with out finger stick. Patient also reports that she has restarted taking Estrace 39m daily due to worsening hot flashes. Patient requested to have medication on her active list.

## 2020-01-17 ENCOUNTER — Other Ambulatory Visit: Payer: Self-pay | Admitting: Family Medicine

## 2020-01-17 NOTE — Telephone Encounter (Signed)
Approved per protocol,

## 2020-02-14 ENCOUNTER — Ambulatory Visit: Payer: 59 | Admitting: Dermatology

## 2020-02-20 ENCOUNTER — Ambulatory Visit: Payer: 59 | Admitting: Dermatology

## 2020-02-28 ENCOUNTER — Encounter: Payer: Self-pay | Admitting: Family Medicine

## 2020-02-28 ENCOUNTER — Other Ambulatory Visit: Payer: Self-pay | Admitting: Family Medicine

## 2020-02-28 ENCOUNTER — Ambulatory Visit: Payer: 59 | Admitting: Dermatology

## 2020-02-28 MED ORDER — FREESTYLE LITE TEST VI STRP: ORAL_STRIP | 12 refills | Status: DC

## 2020-03-04 NOTE — Telephone Encounter (Signed)
Dr. B what labs would you like ordered?

## 2020-03-04 NOTE — Telephone Encounter (Signed)
She really just needs an A1c (POCT).  She is actually too early for it though as last one was 6/23.  Needs to be at least 3 months.  Could push back visit by 2 weeks and make it work if she wanted or do it after her visit.

## 2020-03-27 DIAGNOSIS — H5213 Myopia, bilateral: Secondary | ICD-10-CM | POA: Diagnosis not present

## 2020-04-03 ENCOUNTER — Other Ambulatory Visit: Payer: Self-pay | Admitting: Family Medicine

## 2020-04-03 ENCOUNTER — Encounter: Payer: Self-pay | Admitting: Family Medicine

## 2020-04-03 DIAGNOSIS — R232 Flushing: Secondary | ICD-10-CM

## 2020-04-03 NOTE — Telephone Encounter (Signed)
Requested medication (s) are due for refill today -no  Requested medication (s) are on the active medication list -no  Future visit scheduled -yes  Last refill: 10/18/19  Notes to clinic: Request RF of medication no longer current on medication list- sent for review   Requested Prescriptions  Pending Prescriptions Disp Refills   progesterone (PROMETRIUM) 200 MG capsule [Pharmacy Med Name: PROGESTERONE 200 MG CAPSULE 200 Capsule] 36 capsule 0    Sig: TAKE 1 CAPSULE BY MOUTH DAILY TAKE CALENDAR DAYS 1 THRU 12 EACH MONTH      OB/GYN:  Progestins Passed - 04/03/2020  1:25 PM      Passed - Valid encounter within last 12 months    Recent Outpatient Visits           3 months ago Annual physical exam   [facility], Dionne Bucy, MD   10 months ago Type 2 diabetes mellitus without complication, with long-term current use of insulin [facility])   [facility], Dionne Bucy, MD   1 year ago Encounter for annual physical exam   [facility], Dionne Bucy, MD   1 year ago Type 2 diabetes mellitus without complication, with long-term current use of insulin [facility])   [facility], Dionne Bucy, MD   1 year ago Type 2 diabetes mellitus without complication, with long-term current use of insulin [facility])   [place] Bacigalupo, Dionne Bucy, MD       Future Appointments             In 2 days Bacigalupo, Dionne Bucy, MD [facility]   In 2 months Bacigalupo, Dionne Bucy, MD [facility]             Signed Prescriptions Disp Refills   TRULICITY 1.27 NT/7.0YF SOPN 6 mL 1    Sig: INJECT 0.75 MG INTO THE SKIN ONCE A WEEK.      Endocrinology:  Diabetes - GLP-1 Receptor Agonists Failed - 04/03/2020  1:25 PM      Failed - HBA1C is between 0 and 7.9 and within 180 days    Hgb A1c MFr Bld  Date Value Ref Range Status  12/13/2019 8.7 (H) 4.8 - 5.6 % Final    Comment:              Prediabetes: 5.7 - 6.4          Diabetes: >6.4          Glycemic control for adults with diabetes: <7.0           Passed - Valid encounter within last 6 months    Recent Outpatient Visits           3 months ago Annual physical exam   [facility], Dionne Bucy, MD   10 months ago Type 2 diabetes mellitus without complication, with long-term current use of insulin [facility])   [facility], Dionne Bucy, MD   1 year ago Encounter for annual physical exam   [facility] [place], Dionne Bucy, MD   1 year ago Type 2 diabetes mellitus without complication, with long-term current use of insulin [facility])   [facility], Dionne Bucy, MD   1 year ago Type 2 diabetes mellitus without complication, with long-term current use of insulin [facility])   [facility], Dionne Bucy, MD       Future Appointments  In 2 days Bacigalupo, Dionne Bucy, MD [facility] [place]   In 2 months Bacigalupo, Dionne Bucy, MD [facility]                Requested Prescriptions  Pending Prescriptions Disp Refills   progesterone (PROMETRIUM) 200 MG capsule [Pharmacy Med Name: PROGESTERONE 200 MG CAPSULE 200 Capsule] 36 capsule 0    Sig: TAKE 1 CAPSULE BY MOUTH DAILY TAKE CALENDAR DAYS 1 THRU 12 EACH MONTH      OB/GYN:  Progestins Passed - 04/03/2020  1:25 PM      Passed - Valid encounter within last 12 months    Recent Outpatient Visits           3 months ago Annual physical exam   [facility] [place], Dionne Bucy, MD   10 months ago Type 2 diabetes mellitus without complication, with long-term current use of insulin [facility])   [facility], Dionne Bucy, MD   1 year ago Encounter for annual physical exam   [facility] [place], Dionne Bucy, MD   1 year ago Type 2 diabetes mellitus without complication, with long-term current  use of insulin [facility])   [facility], Dionne Bucy, MD   1 year ago Type 2 diabetes mellitus without complication, with long-term current use of insulin [facility])   [facility] Bacigal[place], Dionne Bucy, MD       Future Appointments             In 2 days Bacigalupo, Dionne Bucy, MD [facility]   In 2 months Bacigalupo, Dionne Bucy, MD [facility]             Signed Prescriptions Disp Refills   TRULICITY 7.42 VZ/5.6LO SOPN 6 mL 1    Sig: INJECT 0.75 MG INTO THE SKIN ONCE A WEEK.      Endocrinology:  Diabetes - GLP-1 Receptor Agonists Failed - 04/03/2020  1:25 PM      Failed - HBA1C is between 0 and 7.9 and within 180 days    Hgb A1c MFr Bld  Date Value Ref Range Status  12/13/2019 8.7 (H) 4.8 - 5.6 % Final    Comment:             Prediabetes: 5.7 - 6.4          Diabetes: >6.4          Glycemic control for adults with diabetes: <7.0           Passed - Valid encounter within last 6 months    Recent Outpatient Visits           3 months ago Annual physical exam   [facility] [place], Dionne Bucy, MD   10 months ago Type 2 diabetes mellitus without complication, with long-term current use of insulin [facility])   [facility], Dionne Bucy, MD   1 year ago Encounter for annual physical exam   [facility] [place], Dionne Bucy, MD   1 year ago Type 2 diabetes mellitus without complication, with long-term current use of insulin [facility])   [facility] [place], Dionne Bucy, MD   1 year ago Type 2 diabetes mellitus without complication, with long-term current use of insulin [facility])   [facility], Dionne Bucy, MD       Future Appointments             In 2 days  Bacigalupo, Dionne Bucy, MD [facility], [place]   In 2 months Bacigalupo, Dionne Bucy, MD [facility]

## 2020-04-05 ENCOUNTER — Encounter: Payer: Self-pay | Admitting: Family Medicine

## 2020-04-05 ENCOUNTER — Telehealth: Payer: 59 | Admitting: Family Medicine

## 2020-04-05 VITALS — BP 124/82 | Wt 125.0 lb

## 2020-04-05 DIAGNOSIS — R232 Flushing: Secondary | ICD-10-CM

## 2020-04-05 DIAGNOSIS — Z794 Long term (current) use of insulin: Secondary | ICD-10-CM | POA: Diagnosis not present

## 2020-04-05 DIAGNOSIS — E119 Type 2 diabetes mellitus without complications: Secondary | ICD-10-CM | POA: Diagnosis not present

## 2020-04-05 LAB — POCT GLYCOSYLATED HEMOGLOBIN (HGB A1C): Hemoglobin A1C: 6.5 % — AB (ref 4.0–5.6)

## 2020-04-05 MED ORDER — PROGESTERONE 200 MG PO CAPS: 200.0000 mg | ORAL_CAPSULE | Freq: Every day | ORAL | 11 refills | Status: DC

## 2020-04-05 MED ORDER — ESTRADIOL 1 MG PO TABS: 1.0000 mg | ORAL_TABLET | Freq: Every day | ORAL | 5 refills | Status: DC

## 2020-04-05 NOTE — Assessment & Plan Note (Signed)
Well-controlled Continue current medications Did not tolerate higher dose of Trulicity due to weight loss and hair loss On ACE inhibitor and statin Up-to-date on screenings and vaccines We will send ROI for recent eye exam in [place] eye Follow-up in 4 to 6 months

## 2020-04-05 NOTE — Assessment & Plan Note (Signed)
Longstanding Worsened after stopping HRT Has resumed estradiol and progesterone at previous doses Refill sent today

## 2020-04-05 NOTE — Progress Notes (Signed)
MyChart Video Visit    Virtual Visit via Video Note   This visit type was conducted due to national recommendations for restrictions regarding the COVID-19 Pandemic (e.g. social distancing) in an effort to limit this patient's exposure and mitigate transmission in our community. This patient is at least at moderate risk for complications without adequate follow up. This format is felt to be most appropriate for this patient at this time. Physical exam was limited by quality of the video and audio technology used for the visit.    Patient location: home Provider location: [place] involved in the visit: patient, provider  I discussed the limitations of evaluation and management by telemedicine and the availability of in person appointments. The patient expressed understanding and agreed to proceed.  Interactive audio and video communications were attempted, although failed due to patient's inability to connect to video. Continued visit with audio only interaction with patient agreement.   Patient: Lynn Coleman   DOB: Oct 07, 1970   49 y.o. Female  MRN: [id number] Visit Date: 04/05/2020  Today's healthcare provider: Lavon Paganini, MD   Chief Complaint  Patient presents with  . Diabetes   Subjective    HPI Diabetes Mellitus Type II, Follow-up  Lab Results  Component Value Date   HGBA1C 6.5 (A) 04/05/2020   HGBA1C 8.7 (H) 12/13/2019   HGBA1C 6.2 (A) 05/31/2019   Wt Readings from Last 3 Encounters:  04/05/20 125 lb (56.7 kg)  12/13/19 124 lb 6.4 oz (56.4 kg)  05/31/19 131 lb (59.4 kg)   Last seen for diabetes 4 months ago.  Management since then includes no changes. She reports good compliance with treatment. She is not having side effects.  Symptoms: No fatigue No foot ulcerations  No appetite changes No nausea  No paresthesia of the feet  No polydipsia  No polyuria No visual disturbances   No vomiting     Home blood sugar  records: 83 fasting this AM   Pertinent Labs: Lab Results  Component Value Date   CHOL 156 12/13/2019   HDL 53 12/13/2019   LDLCALC 95 12/13/2019   TRIG 34 12/13/2019   CHOLHDL 2.9 12/13/2019   Lab Results  Component Value Date   NA 138 12/13/2019   K 4.2 12/13/2019   CREATININE 0.71 12/13/2019   GFRNONAA 100 12/13/2019   GFRAA 115 12/13/2019   GLUCOSE 71 12/13/2019      Social History   Tobacco Use  . Smoking status: Never Smoker  . Smokeless tobacco: Never Used  Vaping Use  . Vaping Use: Never used  Substance Use Topics  . Alcohol use: Yes    Comment: occasional. 1 glass of wine 2-3 times per month  . Drug use: No      Medications: Outpatient Medications Prior to Visit  Medication Sig  . atorvastatin (LIPITOR) 40 MG tablet Take 1 tablet (40 mg total) by mouth daily.  . Blood Glucose Monitoring Suppl (FREESTYLE LITE) DEVI Use to check blood glucose once daily  . cetirizine (ZYRTEC) 10 MG tablet Take 10 mg by mouth daily.  . clonazePAM (KLONOPIN) 0.5 MG tablet Take 1 tablet (0.5 mg total) by mouth 2 (two) times daily as needed.  Marland Kitchen glucose blood (FREESTYLE LITE) test strip Use as instructed to check blood glucose once daily  . glucose blood (ONE TOUCH ULTRA TEST) test strip Use 2 (two) times daily.  . Lancets (FREESTYLE) lancets Use as instructed  . LANTUS SOLOSTAR 100 UNIT/ML Solostar Pen Inject  11-12 Units into the skin daily at 10 pm.  . lisinopril (ZESTRIL) 10 MG tablet TAKE 1 TABLET BY MOUTH DAILY.  . metFORMIN (GLUCOPHAGE) 1000 MG tablet TAKE 1 TABLET BY MOUTH 2 TIMES DAILY WITH A MEAL.  . Multiple Vitamin (MULTIVITAMIN) tablet Take 1 tablet by mouth daily.  Marland Kitchen triamcinolone ointment (KENALOG) 0.5 % Apply 1 application topically 2 (two) times daily.  . TRULICITY 1.59 EL/0.7AJ SOPN INJECT 0.75 MG INTO THE SKIN ONCE A WEEK.   No facility-administered medications prior to visit.    Review of Systems  Constitutional: Negative.   Respiratory: Negative.    Cardiovascular: Negative.   Musculoskeletal: Negative.   Neurological: Negative.    Last hemoglobin A1c Lab Results  Component Value Date   HGBA1C 6.5 (A) 04/05/2020      Objective    BP 124/82   Wt 125 lb (56.7 kg)   BMI 19.58 kg/m  BP Readings from Last 3 Encounters:  04/05/20 124/82  12/13/19 128/80  05/31/19 110/74   Wt Readings from Last 3 Encounters:  04/05/20 125 lb (56.7 kg)  12/13/19 124 lb 6.4 oz (56.4 kg)  05/31/19 131 lb (59.4 kg)      Physical Exam  Speaking in full sentences in NAD   Assessment & Plan     Problem List Items Addressed This Visit      Cardiovascular and Mediastinum   Hot flashes    Longstanding Worsened after stopping HRT Has resumed estradiol and progesterone at previous doses Refill sent today        Endocrine   Type 2 diabetes mellitus without complication, with long-term current use of insulin ([place]) - Primary    Well-controlled Continue current medications Did not tolerate higher dose of Trulicity due to weight loss and hair loss On ACE inhibitor and statin Up-to-date on screenings and vaccines We will send ROI for recent eye exam in [place] eye Follow-up in 4 to 6 months      Relevant Orders   POCT glycosylated hemoglobin (Hb A1C) (Completed)       Return in about 4 months (around 08/06/2020) for chronic disease f/u.     I discussed the assessment and treatment plan with the patient. The patient was provided an opportunity to ask questions and all were answered. The patient agreed with the plan and demonstrated an understanding of the instructions.   The patient was advised to call back or seek an in-person evaluation if the symptoms worsen or if the condition fails to improve as anticipated.  I provided 22 minutes of non-face-to-face time during this encounter.  I, Lavon Paganini, MD, have reviewed all documentation for this visit. The documentation on 04/05/20 for the exam, diagnosis, procedures, and orders  are all accurate and complete.   [place], Dionne Bucy, MD, MPH [facility]

## 2020-04-06 LAB — HM DIABETES EYE EXAM

## 2020-04-09 ENCOUNTER — Other Ambulatory Visit: Payer: Self-pay | Admitting: Family Medicine

## 2020-04-10 ENCOUNTER — Encounter: Payer: Self-pay | Admitting: Family Medicine

## 2020-04-10 ENCOUNTER — Ambulatory Visit: Payer: Self-pay | Admitting: Family Medicine

## 2020-04-11 ENCOUNTER — Encounter: Payer: Self-pay | Admitting: Family Medicine

## 2020-06-05 ENCOUNTER — Ambulatory Visit: Payer: Self-pay | Admitting: Family Medicine

## 2020-06-26 ENCOUNTER — Encounter: Payer: Self-pay | Admitting: Family Medicine

## 2020-07-03 ENCOUNTER — Telehealth: Payer: Self-pay | Admitting: Family Medicine

## 2020-07-03 ENCOUNTER — Other Ambulatory Visit: Payer: Self-pay | Admitting: Family Medicine

## 2020-07-03 DIAGNOSIS — R232 Flushing: Secondary | ICD-10-CM

## 2020-07-03 NOTE — Telephone Encounter (Signed)
Called to inform the doctor that the patient's insurance will not cover the LANTUS SOLOSTAR 100 UNIT/ML Solostar Pen, and the patient will need a new script for basaglar instead.  Please advise and call pharmacy to discuss at [phone]

## 2020-07-04 ENCOUNTER — Other Ambulatory Visit: Payer: Self-pay | Admitting: Family Medicine

## 2020-07-04 MED ORDER — BASAGLAR KWIKPEN 100 UNIT/ML ~~LOC~~ SOPN: 11.0000 [IU] | PEN_INJECTOR | Freq: Every day | SUBCUTANEOUS | 11 refills | Status: DC

## 2020-07-04 NOTE — Telephone Encounter (Signed)
New Rx sent for basaglar

## 2020-07-17 ENCOUNTER — Ambulatory Visit
Admission: RE | Admit: 2020-07-17 | Discharge: 2020-07-17 | Disposition: A | Discharge status: Home / Self Care | Payer: 59 | Source: Ambulatory Visit | Attending: Family Medicine | Admitting: Family Medicine

## 2020-07-17 ENCOUNTER — Other Ambulatory Visit: Payer: Self-pay | Admitting: Family Medicine

## 2020-07-17 ENCOUNTER — Other Ambulatory Visit: Payer: Self-pay

## 2020-07-17 DIAGNOSIS — Z1231 Encounter for screening mammogram for malignant neoplasm of breast: Secondary | ICD-10-CM | POA: Diagnosis not present

## 2020-07-17 NOTE — Telephone Encounter (Signed)
Requested medication (s) are due for refill today: Yes  Requested medication (s) are on the active medication list: Yes  Last refill:  05/31/19  Future visit scheduled: Yes  Notes to clinic:  Unable to refill per protocol, expired Rx     Requested Prescriptions  Pending Prescriptions Disp Refills   atorvastatin (LIPITOR) 40 MG tablet [Pharmacy Med Name: ATORVASTATIN CALCIUM 40 MG 40 Tablet] 90 tablet 3    Sig: TAKE 1 TABLET BY MOUTH DAILY.      Cardiovascular:  Antilipid - Statins Failed - 07/17/2020  9:02 AM      Failed - LDL in normal range and within 360 days    LDL Chol Calc (NIH)  Date Value Ref Range Status  12/13/2019 95 0 - 99 mg/dL Final          Passed - Total Cholesterol in normal range and within 360 days    Cholesterol, Total  Date Value Ref Range Status  12/13/2019 156 100 - 199 mg/dL Final          Passed - HDL in normal range and within 360 days    HDL  Date Value Ref Range Status  12/13/2019 53 >39 mg/dL Final          Passed - Triglycerides in normal range and within 360 days    Triglycerides  Date Value Ref Range Status  12/13/2019 34 0 - 149 mg/dL Final          Passed - Patient is not pregnant      Passed - Valid encounter within last 12 months    Recent Outpatient Visits           3 months ago Type 2 diabetes mellitus without complication, with long-term current use of insulin ([place])   [facility] [place], Dionne Bucy, MD   7 months ago Annual physical exam   [facility] [place], Dionne Bucy, MD   1 year ago Type 2 diabetes mellitus without complication, with long-term current use of insulin [facility])   [facility], Dionne Bucy, MD   1 year ago Encounter for annual physical exam   [facility] [place], Dionne Bucy, MD   1 year ago Type 2 diabetes mellitus without complication, with long-term current use of insulin [facility])   [place], Dionne Bucy, MD        Future Appointments             In 1 month Bacigalupo, Dionne Bucy, MD [facility]              Signed Prescriptions Disp Refills   metFORMIN (GLUCOPHAGE) 1000 MG tablet 180 tablet 0    Sig: TAKE 1 TABLET BY MOUTH 2 TIMES DAILY WITH A MEAL.      Endocrinology:  Diabetes - Biguanides Passed - 07/17/2020  9:02 AM      Passed - Cr in normal range and within 360 days    Creatinine, Ser  Date Value Ref Range Status  12/13/2019 0.71 0.57 - 1.00 mg/dL Final          Passed - HBA1C is between 0 and 7.9 and within 180 days    Hemoglobin A1C  Date Value Ref Range Status  04/05/2020 6.5 (A) 4.0 - 5.6 % Final   Hgb A1c MFr Bld  Date Value Ref Range Status  12/13/2019 8.7 (H) 4.8 - 5.6 % Final    Comment:  Prediabetes: 5.7 - 6.4          Diabetes: >6.4          Glycemic control for adults with diabetes: <7.0           Passed - AA eGFR in normal range and within 360 days    GFR calc Af Amer  Date Value Ref Range Status  12/13/2019 115 >59 mL/min/1.73 Final    Comment:    **[facility] currently reports eGFR in compliance with the current**   recommendations of the [employer]. [facility] will   update reporting as new guidelines are published from the NKF-ASN   Task force.    GFR calc non Af Amer  Date Value Ref Range Status  12/13/2019 100 >59 mL/min/1.73 Final          Passed - Valid encounter within last 6 months    Recent Outpatient Visits           3 months ago Type 2 diabetes mellitus without complication, with long-term current use of insulin [facility])   [facility], Dionne Bucy, MD   7 months ago Annual physical exam   [facility], Dionne Bucy, MD   1 year ago Type 2 diabetes mellitus without complication, with long-term current use of insulin [facility])   [facility], Dionne Bucy, MD   1 year ago Encounter for annual physical exam   [facility] [place], Dionne Bucy, MD   1 year ago Type 2 diabetes mellitus without complication, with long-term current use of insulin [facility])   [place], Dionne Bucy, MD       Future Appointments             In 1 month Bacigalupo, Dionne Bucy, MD [facility]               lisinopril (ZESTRIL) 10 MG tablet 90 tablet 0    Sig: TAKE 1 TABLET BY MOUTH DAILY.      Cardiovascular:  ACE Inhibitors Failed - 07/17/2020  9:02 AM      Failed - Cr in normal range and within 180 days    Creatinine, Ser  Date Value Ref Range Status  12/13/2019 0.71 0.57 - 1.00 mg/dL Final          Failed - K in normal range and within 180 days    Potassium  Date Value Ref Range Status  12/13/2019 4.2 3.5 - 5.2 mmol/L Final          Passed - Patient is not pregnant      Passed - Last BP in normal range    BP Readings from Last 1 Encounters:  04/05/20 124/82          Passed - Valid encounter within last 6 months    Recent Outpatient Visits           3 months ago Type 2 diabetes mellitus without complication, with long-term current use of insulin ([place])   [facility] [place], Dionne Bucy, MD   7 months ago Annual physical exam   [facility] [place], Dionne Bucy, MD   1 year ago Type 2 diabetes mellitus without complication, with long-term current use of insulin [facility])   [facility] Dionne Bucy, MD   1 year ago Encounter for annual physical exam   [facility] [place], MD   1 year ago Type 2 diabetes mellitus  without complication, with long-term current use of insulin [facility])   [place], Dionne Bucy, MD       Future Appointments             In 1 month Bacigalupo, Dionne Bucy, MD [facility] [place]

## 2020-09-10 ENCOUNTER — Other Ambulatory Visit: Payer: Self-pay

## 2020-09-10 ENCOUNTER — Other Ambulatory Visit: Payer: Self-pay | Admitting: Family Medicine

## 2020-09-10 ENCOUNTER — Encounter: Payer: Self-pay | Admitting: Family Medicine

## 2020-09-10 ENCOUNTER — Ambulatory Visit: Payer: 59 | Admitting: Family Medicine

## 2020-09-10 VITALS — BP 114/82 | HR 83 | Temp 98.5°F | Wt 129.6 lb

## 2020-09-10 DIAGNOSIS — E1169 Type 2 diabetes mellitus with other specified complication: Secondary | ICD-10-CM

## 2020-09-10 DIAGNOSIS — E1165 Type 2 diabetes mellitus with hyperglycemia: Secondary | ICD-10-CM | POA: Diagnosis not present

## 2020-09-10 DIAGNOSIS — E785 Hyperlipidemia, unspecified: Secondary | ICD-10-CM | POA: Diagnosis not present

## 2020-09-10 DIAGNOSIS — L659 Nonscarring hair loss, unspecified: Secondary | ICD-10-CM | POA: Diagnosis not present

## 2020-09-10 DIAGNOSIS — Z794 Long term (current) use of insulin: Secondary | ICD-10-CM

## 2020-09-10 LAB — POCT GLYCOSYLATED HEMOGLOBIN (HGB A1C)
Est. average glucose Bld gHb Est-mCnc: 160
Hemoglobin A1C: 7.2 % — AB (ref 4.0–5.6)

## 2020-09-10 MED ORDER — EMPAGLIFLOZIN 10 MG PO TABS: 10.0000 mg | ORAL_TABLET | Freq: Every day | ORAL | 5 refills | Status: DC

## 2020-09-10 MED ORDER — BASAGLAR KWIKPEN 100 UNIT/ML ~~LOC~~ SOPN: 11.0000 [IU] | PEN_INJECTOR | Freq: Every day | SUBCUTANEOUS | 3 refills | Status: DC

## 2020-09-10 NOTE — Assessment & Plan Note (Signed)
Previously well controlled Continue statin Plan to recheck lipid panel at CPE in about 3 months Discussed that atorvastatin could potentially be causing her hair thinning as well, so we will keep this in mind

## 2020-09-10 NOTE — Progress Notes (Signed)
Established patient visit   Patient: Lynn Coleman   DOB: 05-05-1971   50 y.o. Female  MRN: [id number] Visit Date: 09/10/2020  Today's healthcare provider: Lavon Paganini, MD   Chief Complaint  Patient presents with  . Diabetes  I,Porsha C McClurkin,acting as a scribe for Lavon Paganini, MD.,have documented all relevant documentation on the behalf of Lavon Paganini, MD,as directed by  Lavon Paganini, MD while in the presence of Lavon Paganini, MD.  Subjective    HPI  Diabetes Mellitus Type II, Follow-up  Lab Results  Component Value Date   HGBA1C 7.2 (A) 09/10/2020   HGBA1C 6.5 (A) 04/05/2020   HGBA1C 8.7 (H) 12/13/2019   Wt Readings from Last 3 Encounters:  09/10/20 129 lb 9.6 oz (58.8 kg)  04/05/20 125 lb (56.7 kg)  12/13/19 124 lb 6.4 oz (56.4 kg)   Last seen for diabetes 5 months ago.  Management since then includes started Basaglar. She reports good compliance with treatment. She is not having side effects.  Symptoms: No fatigue No foot ulcerations  No appetite changes No nausea  No paresthesia of the feet  No polydipsia  No polyuria No visual disturbances   No vomiting     Home blood sugar records: not being checked.  Episodes of hypoglycemia? No    Current insulin regiment: Basaglar Most Recent Eye Exam: 04/06/2020  Current diet habits: well balanced  Pertinent Labs: Lab Results  Component Value Date   CHOL 156 12/13/2019   HDL 53 12/13/2019   LDLCALC 95 12/13/2019   TRIG 34 12/13/2019   CHOLHDL 2.9 12/13/2019   Lab Results  Component Value Date   NA 138 12/13/2019   K 4.2 12/13/2019   CREATININE 0.71 12/13/2019   GFRNONAA 100 12/13/2019   GFRAA 115 12/13/2019   GLUCOSE 71 12/13/2019     ---------------------------------------------------------------------------------------------------   Social History   Tobacco Use  . Smoking status: Never Smoker  . Smokeless tobacco: Never Used  Vaping Use  . Vaping Use:  Never used  Substance Use Topics  . Alcohol use: Yes    Comment: occasional. 1 glass of wine 2-3 times per month  . Drug use: No       Medications: Outpatient Medications Prior to Visit  Medication Sig  . atorvastatin (LIPITOR) 40 MG tablet TAKE 1 TABLET BY MOUTH DAILY.  Marland Kitchen Blood Glucose Monitoring Suppl (FREESTYLE LITE) DEVI Use to check blood glucose once daily  . cetirizine (ZYRTEC) 10 MG tablet Take 10 mg by mouth daily.  . clonazePAM (KLONOPIN) 0.5 MG tablet Take 1 tablet (0.5 mg total) by mouth 2 (two) times daily as needed.  Marland Kitchen estradiol (ESTRACE) 1 MG tablet Take 1 tablet (1 mg total) by mouth daily.  Marland Kitchen glucose blood (FREESTYLE LITE) test strip Use as instructed to check blood glucose once daily  . glucose blood (ONE TOUCH ULTRA TEST) test strip Use 2 (two) times daily.  . Lancets (FREESTYLE) lancets Use as instructed  . lisinopril (ZESTRIL) 10 MG tablet TAKE 1 TABLET BY MOUTH DAILY.  . metFORMIN (GLUCOPHAGE) 1000 MG tablet TAKE 1 TABLET BY MOUTH 2 TIMES DAILY WITH A MEAL.  . Multiple Vitamin (MULTIVITAMIN) tablet Take 1 tablet by mouth daily.  . progesterone (PROMETRIUM) 200 MG capsule Take 1 capsule (200 mg total) by mouth daily. On calendar days 1-12 of each month  . progesterone (PROMETRIUM) 200 MG capsule TAKE 1 CAPSULE BY MOUTH DAILY. TAKE CALENDAR DAYS 1 THRU 12 EACH MONTH  .  triamcinolone ointment (KENALOG) 0.5 % Apply 1 application topically 2 (two) times daily.  . [DISCONTINUED] Insulin Glargine (BASAGLAR KWIKPEN) 100 UNIT/ML Inject 11-12 Units into the skin daily.  . [DISCONTINUED] TRULICITY 7.74 JO/8.7OM SOPN INJECT 0.75 MG INTO THE SKIN ONCE A WEEK.   No facility-administered medications prior to visit.    Review of Systems     Objective    BP 114/82 (BP Location: Right Arm, Patient Position: Sitting, Cuff Size: Normal)   Pulse 83   Temp 98.5 F (36.9 C) (Oral)   Wt 129 lb 9.6 oz (58.8 kg)   SpO2 100%   BMI 20.30 kg/m     Physical Exam Vitals  reviewed.  Constitutional:      General: She is not in acute distress.    Appearance: Normal appearance. She is well-developed. She is not diaphoretic.  HENT:     Head: Normocephalic and atraumatic.  Eyes:     General: No scleral icterus.    Conjunctiva/sclera: Conjunctivae normal.  Neck:     Thyroid: No thyromegaly.  Cardiovascular:     Rate and Rhythm: Normal rate and regular rhythm.     Pulses: Normal pulses.     Heart sounds: Normal heart sounds. No murmur heard.   Pulmonary:     Effort: Pulmonary effort is normal. No respiratory distress.     Breath sounds: Normal breath sounds. No wheezing, rhonchi or rales.  Musculoskeletal:     Cervical back: Neck supple.     Right lower leg: No edema.     Left lower leg: No edema.  Lymphadenopathy:     Cervical: No cervical adenopathy.  Skin:    General: Skin is warm and dry.     Findings: No rash.  Neurological:     Mental Status: She is alert and oriented to person, place, and time. Mental status is at baseline.  Psychiatric:        Mood and Affect: Mood normal.        Behavior: Behavior normal.      Results for orders placed or performed in visit on 09/10/20  POCT glycosylated hemoglobin (Hb A1C)  Result Value Ref Range   Hemoglobin A1C 7.2 (A) 4.0 - 5.6 %   HbA1c POC (<> result, manual entry)     HbA1c, POC (prediabetic range)     HbA1c, POC (controlled diabetic range)     Est. average glucose Bld gHb Est-mCnc 160     Assessment & Plan     Problem List Items Addressed This Visit      Endocrine   Type 2 diabetes mellitus without complication, with long-term current use of insulin ([place]) - Primary    Fairly well controlled A1c is elevated slightly at 7.2 today This may be related to recent vacation She did not tolerate higher dose of Trulicity due to weight loss and hair loss She is now having more hair thinning and breakage, so we will try to discontinue Trulicity We will use Jardiance instead of  Trulicity Discussed possible side effects with SGLT2 inhibitors Start with Jardiance 10 mg daily and consider titration to 25 mg daily Continue insulin at current dose On ACE inhibitor and statin Up-to-date on screenings and vaccinations Follow-up in 3 months and repeat A1c      Relevant Medications   empagliflozin (JARDIANCE) 10 MG TABS tablet   Insulin Glargine (BASAGLAR KWIKPEN) 100 UNIT/ML   Hyperlipidemia associated with type 2 diabetes mellitus ([place])    Previously well controlled Continue statin Plan  to recheck lipid panel at [facility]PE in about 3 months Discussed that atorvastatin could potentially be causing her hair thinning as well, so we will keep this in mind      Relevant Medications   empagliflozin (JARDIANCE) 10 MG TABS tablet   Insulin Glargine (BASAGLAR KWIKPEN) 100 UNIT/ML    Other Visit Diagnoses    Hair thinning        -As above, concerned that this may be related to her Trulicity use as she and I have both seen this in African-American patients on Trulicity previously   Return in about 3 months (around 12/11/2020) for CPE.      I, Lavon Paganini, MD, have reviewed all documentation for this visit. The documentation on 09/10/20 for the exam, diagnosis, procedures, and orders are all accurate and complete.   Oliverio Cho, Dionne Bucy, MD, MPH [facility]

## 2020-09-10 NOTE — Assessment & Plan Note (Signed)
Fairly well controlled A1c is elevated slightly at 7.2 today This may be related to recent vacation She did not tolerate higher dose of Trulicity due to weight loss and hair loss She is now having more hair thinning and breakage, so we will try to discontinue Trulicity We will use Jardiance instead of Trulicity Discussed possible side effects with SGLT2 inhibitors Start with Jardiance 10 mg daily and consider titration to 25 mg daily Continue insulin at current dose On ACE inhibitor and statin Up-to-date on screenings and vaccinations Follow-up in 3 months and repeat A1c

## 2020-09-11 ENCOUNTER — Other Ambulatory Visit: Payer: Self-pay

## 2020-09-13 ENCOUNTER — Other Ambulatory Visit: Payer: Self-pay | Admitting: Family Medicine

## 2020-09-13 MED ORDER — LEVEMIR FLEXTOUCH 100 UNIT/ML ~~LOC~~ SOPN: 11.0000 [IU] | PEN_INJECTOR | Freq: Every day | SUBCUTANEOUS | 11 refills | Status: DC

## 2020-09-21 ENCOUNTER — Other Ambulatory Visit: Payer: Self-pay

## 2020-09-23 ENCOUNTER — Other Ambulatory Visit: Payer: Self-pay

## 2020-09-23 MED FILL — Insulin Detemir Soln Pen-injector 100 Unit/ML: SUBCUTANEOUS | 90 days supply | Qty: 15 | Fill #0 | Status: CN

## 2020-09-24 ENCOUNTER — Other Ambulatory Visit: Payer: Self-pay

## 2020-10-02 ENCOUNTER — Other Ambulatory Visit: Payer: Self-pay

## 2020-10-02 MED FILL — Estradiol Tab 1 MG: ORAL | 90 days supply | Qty: 90 | Fill #0 | Status: AC

## 2020-10-09 ENCOUNTER — Other Ambulatory Visit: Payer: Self-pay

## 2020-10-09 ENCOUNTER — Other Ambulatory Visit: Payer: Self-pay | Admitting: Family Medicine

## 2020-10-09 DIAGNOSIS — R232 Flushing: Secondary | ICD-10-CM

## 2020-10-09 MED ORDER — CLONAZEPAM 0.5 MG PO TABS
0.5000 mg | ORAL_TABLET | Freq: Two times a day (BID) | ORAL | 1 refills | Status: DC | PRN
  Filled 2020-10-09 – 2020-11-06 (×2): qty 30, 15d supply, fill #0

## 2020-10-09 MED ORDER — METFORMIN HCL 1000 MG PO TABS
ORAL_TABLET | Freq: Two times a day (BID) | ORAL | 0 refills | Status: DC
  Filled 2020-10-09: qty 180, 90d supply, fill #0

## 2020-10-09 MED ORDER — PROGESTERONE 200 MG PO CAPS
ORAL_CAPSULE | ORAL | 0 refills | Status: DC
  Filled 2020-10-09: qty 36, 84d supply, fill #0

## 2020-10-09 MED FILL — Atorvastatin Calcium Tab 40 MG (Base Equivalent): ORAL | 90 days supply | Qty: 90 | Fill #0 | Status: AC

## 2020-10-09 MED FILL — Empagliflozin Tab 10 MG: ORAL | 30 days supply | Qty: 30 | Fill #0 | Status: AC

## 2020-10-09 NOTE — Telephone Encounter (Signed)
Future appt in 2 months

## 2020-10-09 NOTE — Telephone Encounter (Signed)
Requested medication (s) are due for refill today: yes  Requested medication (s) are on the active medication list: yes  Last refill:  08/24/19 #30 1 refill  Future visit scheduled: yes  Notes to clinic:  not delegated per protocol     Requested Prescriptions  Pending Prescriptions Disp Refills   clonazePAM (KLONOPIN) 0.5 MG tablet 30 tablet 1    Sig: Take 1 tablet (0.5 mg total) by mouth 2 (two) times daily as needed.      Not Delegated - Psychiatry:  Anxiolytics/Hypnotics Failed - 10/09/2020 11:01 AM      Failed - This refill cannot be delegated      Failed - Urine Drug Screen completed in last 360 days      Passed - Valid encounter within last 6 months    Recent Outpatient Visits           4 weeks ago Type 2 diabetes mellitus with hyperglycemia, with long-term current use of insulin [facility])   [facility], Dionne Bucy, MD   6 months ago Type 2 diabetes mellitus without complication, with long-term current use of insulin [facility])   [facility], Dionne Bucy, MD   10 months ago Annual physical exam   [facility], Dionne Bucy, MD   1 year ago Type 2 diabetes mellitus without complication, with long-term current use of insulin [facility])   [facility], Dionne Bucy, MD   1 year ago Encounter for annual physical exam   [facility]al[facility], Dionne Bucy, MD       Future Appointments             In 2 months Bacigalupo, Dionne Bucy, MD [facility]              Signed Prescriptions Disp Refills   metFORMIN (GLUCOPHAGE) 1000 MG tablet 180 tablet 0    Sig: TAKE 1 TABLET BY MOUTH 2 TIMES DAILY WITH A MEAL.      Endocrinology:  Diabetes - Biguanides Passed - 10/09/2020 11:01 AM      Passed - Cr in normal range and within 360 days    Creatinine, Ser  Date Value Ref Range Status  12/13/2019 0.71 0.57 - 1.00 mg/dL Final          Passed - HBA1C is between 0 and  7.9 and within 180 days    Hemoglobin A1C  Date Value Ref Range Status  09/10/2020 7.2 (A) 4.0 - 5.6 % Final   Hgb A1c MFr Bld  Date Value Ref Range Status  12/13/2019 8.7 (H) 4.8 - 5.6 % Final    Comment:             Prediabetes: 5.7 - 6.4          Diabetes: >6.4          Glycemic control for adults with diabetes: <7.0           Passed - AA eGFR in normal range and within 360 days    GFR calc Af Amer  Date Value Ref Range Status  12/13/2019 115 >59 mL/min/1.73 Final    Comment:    **[facility] currently reports eGFR in compliance with the current**   recommendations of the [employer]. [facility] will   update reporting as new guidelines are published from the NKF-ASN   Task force.    GFR calc non Af Amer  Date Value Ref Range Status  12/13/2019 100 >59  mL/min/1.73 Final          Passed - Valid encounter within last 6 months    Recent Outpatient Visits           4 weeks ago Type 2 diabetes mellitus with hyperglycemia, with long-term current use of insulin [facility])   [facility], Dionne Bucy, MD   6 months ago Type 2 diabetes mellitus without complication, with long-term current use of insulin [facility])   [facility], Dionne Bucy, MD   10 months ago Annual physical exam   [facility], Dionne Bucy, MD   1 year ago Type 2 diabetes mellitus without complication, with long-term current use of insulin [facility])   [facility], Dionne Bucy, MD   1 year ago Encounter for annual physical exam   [facility], Dionne Bucy, MD       Future Appointments             In 2 months Bacigalupo, Dionne Bucy, MD [facility]               progesterone (PROMETRIUM) 200 MG capsule 36 capsule 0    Sig: TAKE 1 CAPSULE BY MOUTH DAILY. TAKE CALENDAR DAYS 1 THRU 12 EACH MONTH      OB/GYN:  Progestins Passed - 10/09/2020 11:01 AM      Passed - Valid  encounter within last 12 months    Recent Outpatient Visits           4 weeks ago Type 2 diabetes mellitus with hyperglycemia, with long-term current use of insulin [facility])   [facility], Dionne Bucy, MD   6 months ago Type 2 diabetes mellitus without complication, with long-term current use of insulin [facility])   [facility], Dionne Bucy, MD   10 months ago Annual physical exam   [facility], Dionne Bucy, MD   1 year ago Type 2 diabetes mellitus without complication, with long-term current use of insulin [facility])   [facility], Dionne Bucy, MD   1 year ago Encounter for annual physical exam   [facility], Dionne Bucy, MD       Future Appointments             In 2 months Bacigalupo, Dionne Bucy, MD [facility] [place]

## 2020-10-10 ENCOUNTER — Other Ambulatory Visit: Payer: Self-pay

## 2020-10-24 ENCOUNTER — Other Ambulatory Visit: Payer: Self-pay

## 2020-10-25 IMAGING — MG DIGITAL SCREENING BILAT W/ TOMO W/ CAD
8 series · 9 of 24 positions shown · non-contrast
Comparison: Previous exam(s).

CLINICAL DATA: Screening.

EXAM:
DIGITAL SCREENING BILATERAL MAMMOGRAM WITH TOMO AND CAD

[L CC synth-2D]
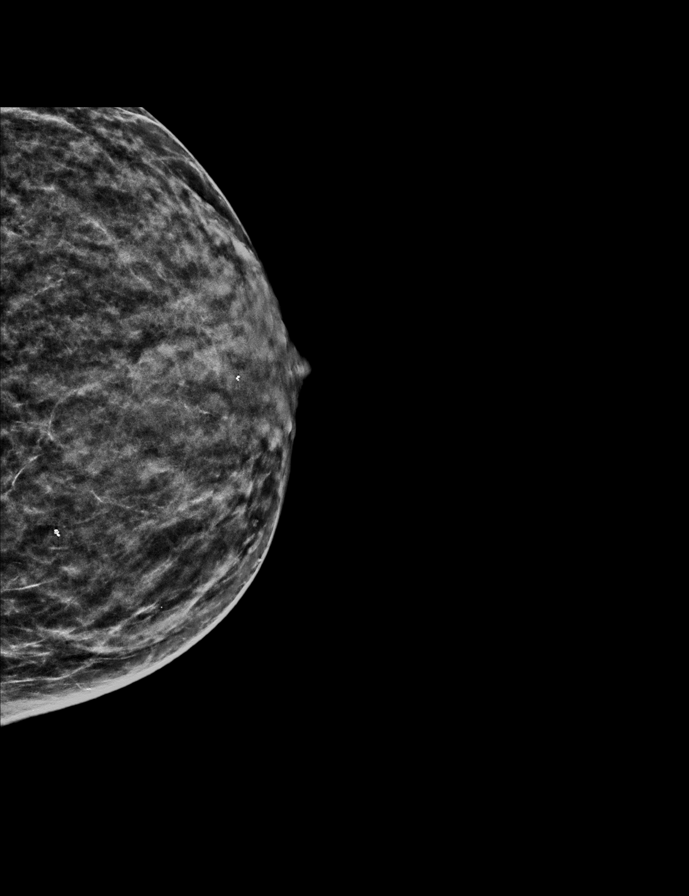

[R MLO synth-2D]
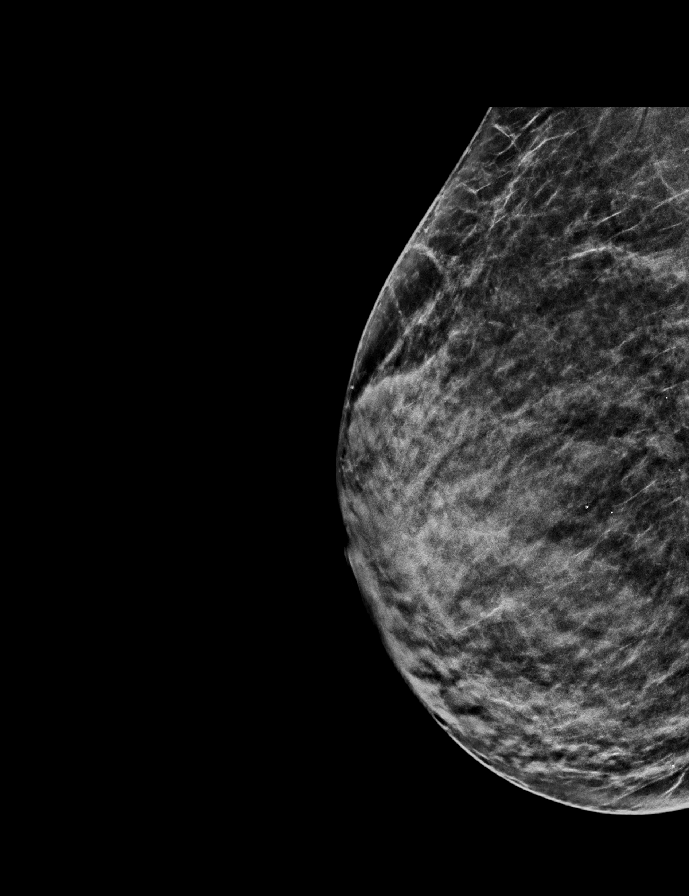

[R CC synth-2D]
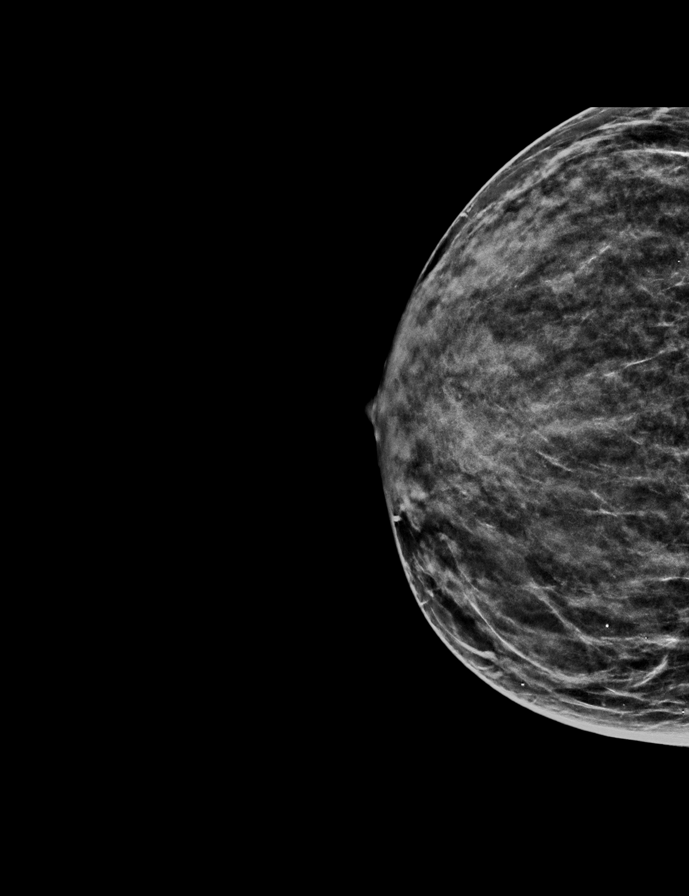

[L MLO synth-2D]
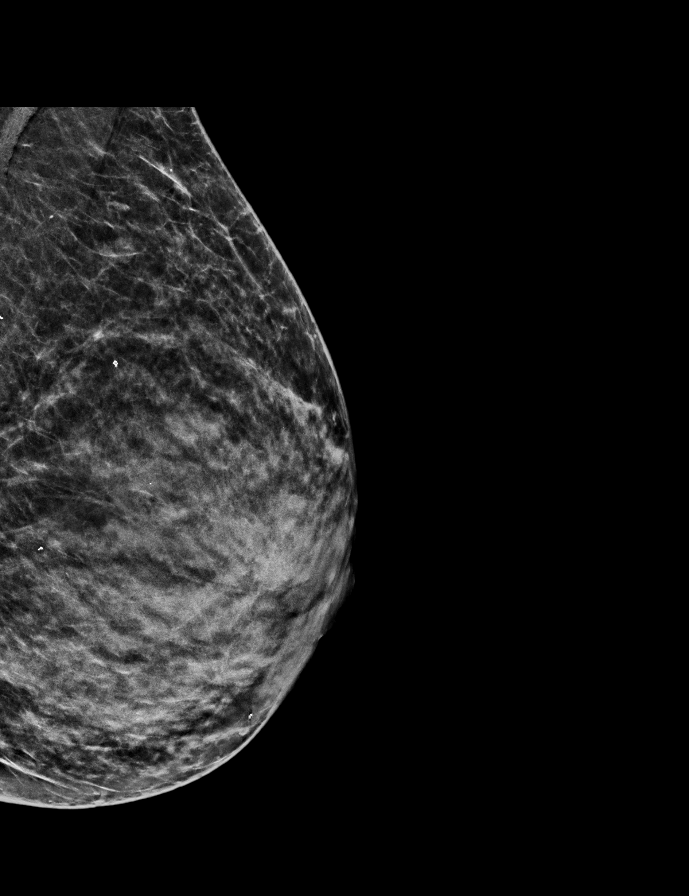

[L MLO tomo · 2 of 52 frames shown]
[frame 17/52]
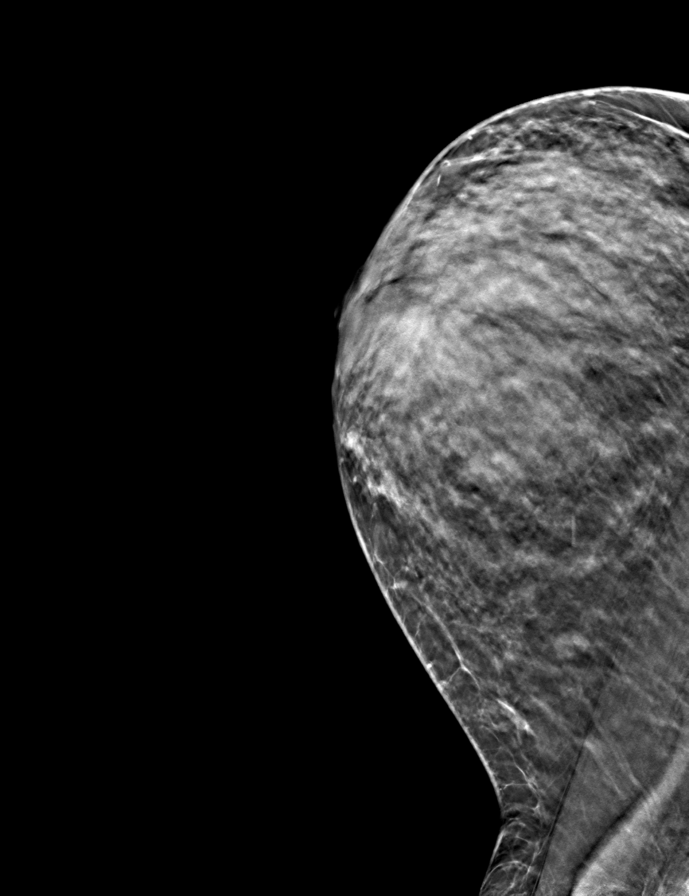
[frame 27/52]
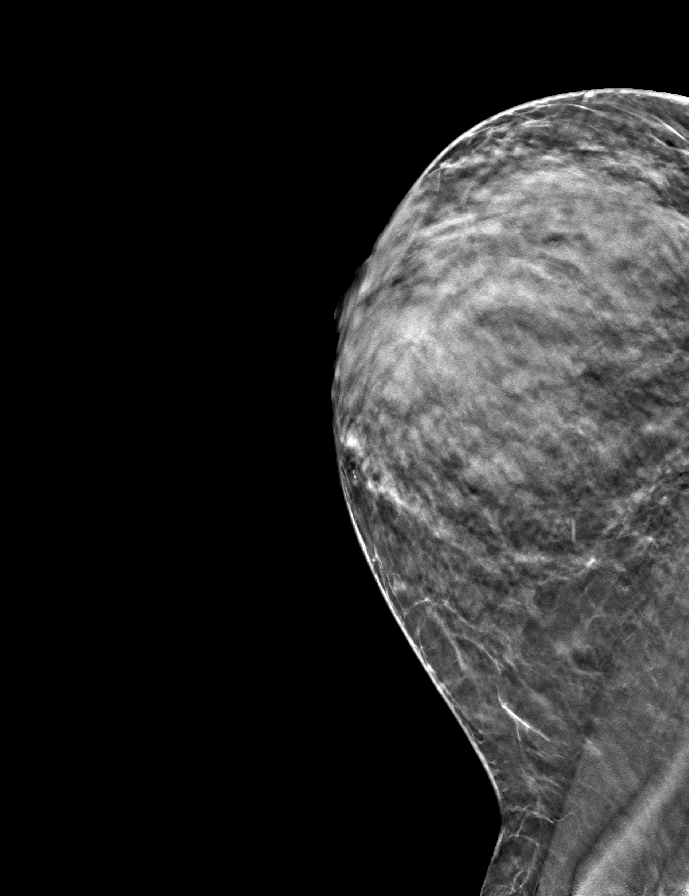

[L CC tomo · tomo slice 25/50.0]
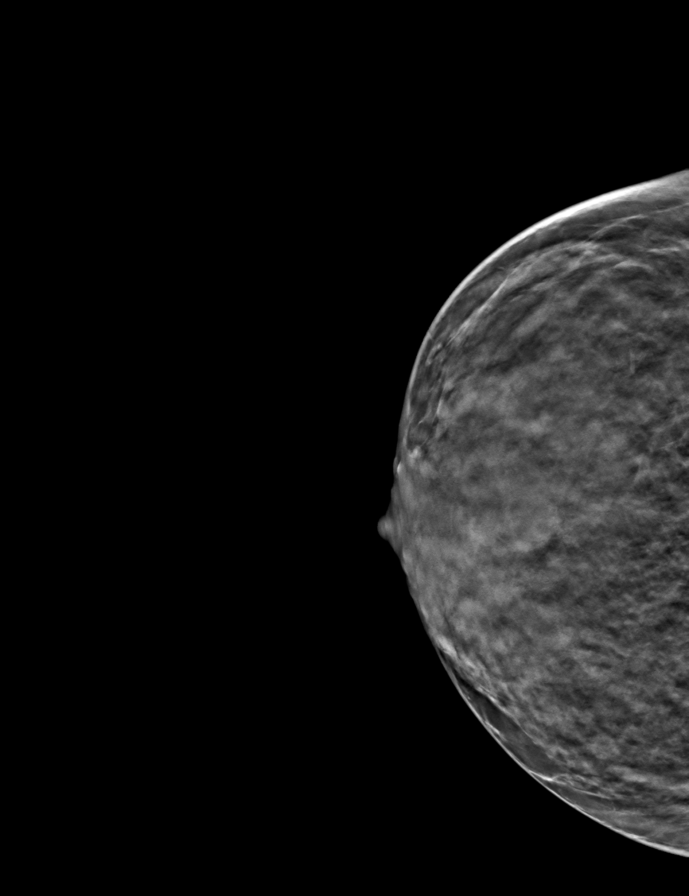

[R CC tomo · tomo slice 27/54.0]
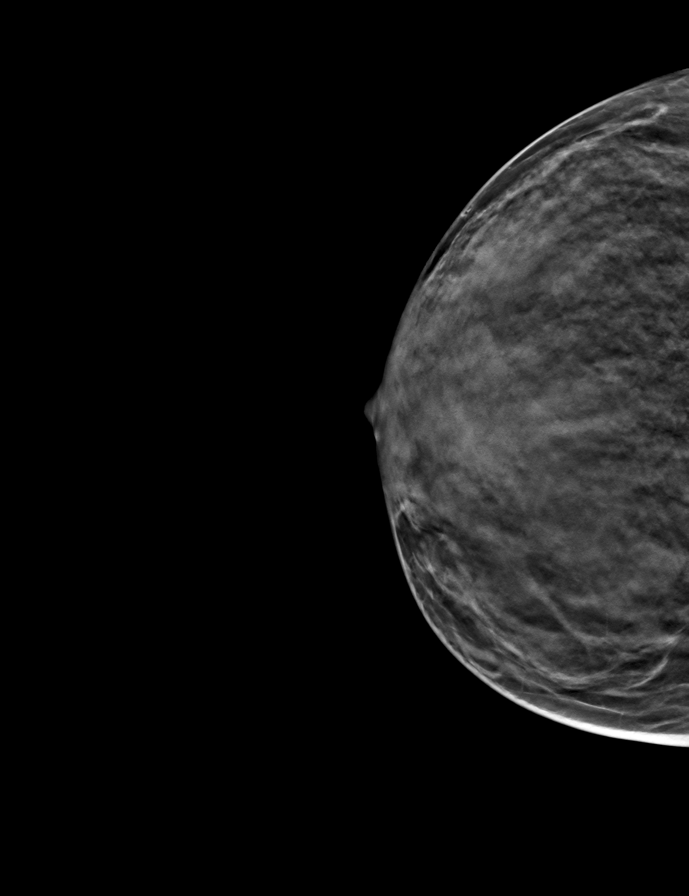

[R MLO tomo · tomo slice 26/51.0]
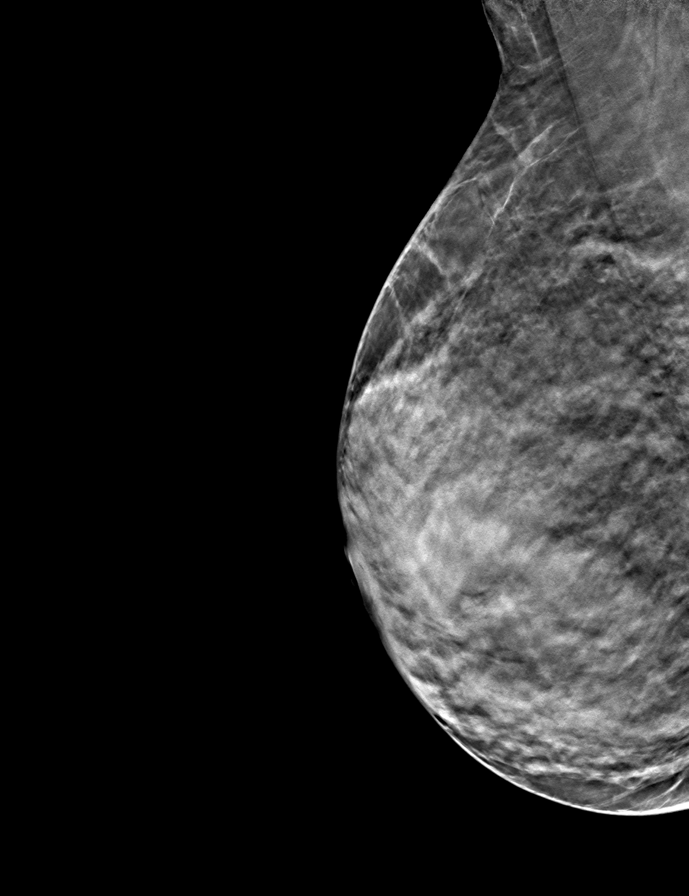

[9 of 24 positions shown; findings below may reference images not displayed]

ACR Breast Density Category d: The breast tissue is extremely dense,
which lowers the sensitivity of mammography
FINDINGS: There are no findings suspicious for malignancy. Images were
processed with CAD.
IMPRESSION: No mammographic evidence of malignancy. A result letter of this
screening mammogram will be mailed directly to the patient.

RECOMMENDATION:
Screening mammogram in one year. (Code:[id number])

BI-RADS CATEGORY  1: Negative.

## 2020-11-06 ENCOUNTER — Other Ambulatory Visit: Payer: Self-pay

## 2020-11-06 ENCOUNTER — Other Ambulatory Visit: Payer: Self-pay | Admitting: Family Medicine

## 2020-11-06 MED FILL — Lisinopril Tab 10 MG: ORAL | 90 days supply | Qty: 90 | Fill #0 | Status: AC

## 2020-11-06 MED FILL — Glucose Blood Test Strip: 90 days supply | Qty: 100 | Fill #0 | Status: AC

## 2020-11-06 MED FILL — Empagliflozin Tab 10 MG: ORAL | 90 days supply | Qty: 90 | Fill #1 | Status: AC

## 2020-12-18 ENCOUNTER — Ambulatory Visit (INDEPENDENT_AMBULATORY_CARE_PROVIDER_SITE_OTHER): Payer: 59 | Admitting: Family Medicine

## 2020-12-18 ENCOUNTER — Encounter: Payer: Self-pay | Admitting: Family Medicine

## 2020-12-18 ENCOUNTER — Other Ambulatory Visit: Payer: Self-pay

## 2020-12-18 VITALS — BP 106/73 | HR 93 | Temp 98.4°F | Resp 16 | Ht 67.0 in | Wt 126.0 lb

## 2020-12-18 DIAGNOSIS — E1169 Type 2 diabetes mellitus with other specified complication: Secondary | ICD-10-CM | POA: Diagnosis not present

## 2020-12-18 DIAGNOSIS — L659 Nonscarring hair loss, unspecified: Secondary | ICD-10-CM

## 2020-12-18 DIAGNOSIS — E785 Hyperlipidemia, unspecified: Secondary | ICD-10-CM | POA: Diagnosis not present

## 2020-12-18 DIAGNOSIS — Z0000 Encounter for general adult medical examination without abnormal findings: Secondary | ICD-10-CM | POA: Diagnosis not present

## 2020-12-18 DIAGNOSIS — E119 Type 2 diabetes mellitus without complications: Secondary | ICD-10-CM

## 2020-12-18 DIAGNOSIS — Z23 Encounter for immunization: Secondary | ICD-10-CM

## 2020-12-18 DIAGNOSIS — Z794 Long term (current) use of insulin: Secondary | ICD-10-CM | POA: Diagnosis not present

## 2020-12-18 LAB — POCT UA - MICROALBUMIN: Microalbumin Ur, POC: 50 mg/L

## 2020-12-18 MED ORDER — ESTRADIOL 1 MG PO TABS
1.0000 mg | ORAL_TABLET | Freq: Every day | ORAL | 3 refills | Status: DC
  Filled 2020-12-18 – 2021-01-10 (×2): qty 90, 90d supply, fill #0
  Filled 2021-04-09: qty 90, 90d supply, fill #1
  Filled 2021-05-07: qty 90, 90d supply, fill #2

## 2020-12-18 MED ORDER — METFORMIN HCL 1000 MG PO TABS
ORAL_TABLET | Freq: Two times a day (BID) | ORAL | 3 refills | Status: DC
  Filled 2020-12-18 – 2021-01-10 (×2): qty 180, 90d supply, fill #0
  Filled 2021-04-09: qty 180, 90d supply, fill #1
  Filled 2021-07-09: qty 180, 90d supply, fill #2
  Filled 2021-10-13: qty 180, 90d supply, fill #3

## 2020-12-18 MED ORDER — LISINOPRIL 10 MG PO TABS
ORAL_TABLET | Freq: Every day | ORAL | 3 refills | Status: DC
  Filled 2020-12-18 – 2021-02-05 (×2): qty 90, 90d supply, fill #0
  Filled 2021-05-07: qty 90, 90d supply, fill #1
  Filled 2021-08-13: qty 90, 90d supply, fill #2

## 2020-12-18 MED ORDER — PROGESTERONE 200 MG PO CAPS
200.0000 mg | ORAL_CAPSULE | Freq: Every day | ORAL | 3 refills | Status: DC
  Filled 2020-12-18 – 2021-01-10 (×2): qty 36, 84d supply, fill #0
  Filled 2021-04-09: qty 36, 84d supply, fill #1

## 2020-12-18 MED ORDER — EMPAGLIFLOZIN 10 MG PO TABS
ORAL_TABLET | Freq: Every day | ORAL | 3 refills | Status: DC
  Filled 2020-12-18: qty 90, 90d supply, fill #0

## 2020-12-18 NOTE — Assessment & Plan Note (Signed)
Previously well controlled Continue statin Repeat FLP and CMP Goal LDL < 70 

## 2020-12-18 NOTE — Assessment & Plan Note (Signed)
Previously with slightly elevated A1c Continue current meds Move insulin dose to morning to avoid AM lows Foot exam today Otherwise UTD on screenings/vaccines On ACEi and statin Repeat A1c F/u in 6 m

## 2020-12-18 NOTE — Progress Notes (Signed)
Complete physical exam   Patient: Lynn Coleman   DOB: [birth year]   50 y.o. Female  MRN: [id number] Visit Date: 12/18/2020  Today's healthcare provider: Lavon Paganini, MD   Chief Complaint  Patient presents with   Annual Exam   Subjective    Lynn Coleman is a 50 y.o. female who presents today for a complete physical exam.  She reports consuming a general diet. The patient does not participate in regular exercise at present. She generally feels well. She reports sleeping well. She does not have additional problems to discuss today.   Has had some morning hypoglycemia. Will try AM insulin instead of PM  Past Medical History:  Diagnosis Date   Allergic rhinitis    Anxiety    Diabetes mellitus without complication ([place])    History of chicken pox    Hyperlipidemia    Past Surgical History:  Procedure Laterality Date   PLEURAL SCARIFICATION     Social History   Socioeconomic History   Marital status: Married    Spouse name: Darrell   Number of children: 0   Years of education: Not on file   Highest education level: Doctorate  Occupational History    Employer: [employer]  Tobacco Use   Smoking status: Never   Smokeless tobacco: Never  Vaping Use   Vaping Use: Never used  Substance and Sexual Activity   Alcohol use: Yes    Comment: occasional. 1 glass of wine 2-3 times per month   Drug use: No   Sexual activity: Yes    Partners: Male    Birth control/protection: Patch  Other Topics Concern   Not on file  Social History Narrative   Not on file   Social Determinants of Health   Financial Resource Strain: Not on file  Food Insecurity: Not on file  Transportation Needs: Not on file  Physical Activity: Not on file  Stress: Not on file  Social Connections: Not on file  Intimate Partner Violence: Not on file   Family Status  Relation Name Status   Mother  Deceased   Father  Deceased   Sister  Alive   Brother  Deceased   MGM  (Not  Specified)   MGF  (Not Specified)   PGM  (Not Specified)   Neg Hx  (Not Specified)   Family History  Problem Relation Age of Onset   Arthritis Mother    Heart disease Mother    Hypertension Mother    Hyperlipidemia Mother    Dementia Mother    Hypertension Father    Lung cancer Father        smoker   Hypertension Sister    Glaucoma Brother    Pancreatic cancer Brother    Asthma Maternal Grandmother    Stomach cancer Maternal Grandfather    Gastric cancer Maternal Grandfather    Arthritis Paternal Grandmother    Breast cancer Neg Hx    Colon cancer Neg Hx    No Known Allergies  Patient Care Team: Macen Joslin, Dionne Bucy, MD as PCP - General (Family Medicine)   Medications: Outpatient Medications Prior to Visit  Medication Sig   atorvastatin (LIPITOR) 40 MG tablet TAKE 1 TABLET BY MOUTH DAILY.   Blood Glucose Monitoring Suppl (FREESTYLE LITE) DEVI Use to check blood glucose once daily   cetirizine (ZYRTEC) 10 MG tablet Take 10 mg by mouth daily.   clonazePAM (KLONOPIN) 0.5 MG tablet Take 1 tablet (0.5 mg total) by mouth 2 (  two) times daily as needed.   glucose blood (ONE TOUCH ULTRA TEST) test strip Use 2 (two) times daily.   glucose blood test strip USE AS INSTRUCTED TO CHECK BLOOD GLUCOSE ONCE DAILY   insulin detemir (LEVEMIR) 100 UNIT/ML FlexPen INJECT 11-12 UNITS INTO THE SKIN DAILY.   Lancets (FREESTYLE) lancets Use as instructed   Multiple Vitamin (MULTIVITAMIN) tablet Take 1 tablet by mouth daily.   triamcinolone ointment (KENALOG) 0.5 % Apply 1 application topically 2 (two) times daily.   [DISCONTINUED] empagliflozin (JARDIANCE) 10 MG TABS tablet TAKE 1 TABLET (10 MG TOTAL) BY MOUTH DAILY BEFORE BREAKFAST.   [DISCONTINUED] estradiol (ESTRACE) 1 MG tablet Take 1 tablet (1 mg total) by mouth daily.   [DISCONTINUED] estradiol (ESTRACE) 1 MG tablet TAKE 1 TABLET (1 MG TOTAL) BY MOUTH DAILY. (Patient not taking: Reported on 12/13/2019)   [DISCONTINUED] lisinopril (ZESTRIL)  10 MG tablet TAKE 1 TABLET BY MOUTH DAILY.   [DISCONTINUED] metFORMIN (GLUCOPHAGE) 1000 MG tablet TAKE 1 TABLET BY MOUTH 2 TIMES DAILY WITH A MEAL.   [DISCONTINUED] progesterone (PROMETRIUM) 200 MG capsule Take 1 capsule (200 mg total) by mouth daily. On calendar days 1-12 of each month   [DISCONTINUED] progesterone (PROMETRIUM) 200 MG capsule TAKE 1 CAPSULE BY MOUTH DAILY. TAKE CALENDAR DAYS 1 THRU 12 EACH MONTH   No facility-administered medications prior to visit.    Review of Systems  All other systems reviewed and are negative.    Objective    BP 106/73   Pulse 93   Temp 98.4 F (36.9 C)   Resp 16   Ht 5' 7"  (1.702 m)   Wt 126 lb (57.2 kg)   BMI 19.73 kg/m    Physical Exam Vitals reviewed.  Constitutional:      General: She is not in acute distress.    Appearance: Normal appearance. She is well-developed. She is not diaphoretic.  HENT:     Head: Normocephalic and atraumatic.     Right Ear: Tympanic membrane, ear canal and external ear normal.     Left Ear: Tympanic membrane, ear canal and external ear normal.     Nose: Nose normal.     Mouth/Throat:     Mouth: Mucous membranes are moist.     Pharynx: Oropharynx is clear. No oropharyngeal exudate.  Eyes:     General: No scleral icterus.    Conjunctiva/sclera: Conjunctivae normal.     Pupils: Pupils are equal, round, and reactive to light.  Neck:     Thyroid: No thyromegaly.  Cardiovascular:     Rate and Rhythm: Normal rate and regular rhythm.     Pulses: Normal pulses.     Heart sounds: Normal heart sounds. No murmur heard. Pulmonary:     Effort: Pulmonary effort is normal. No respiratory distress.     Breath sounds: Normal breath sounds. No wheezing or rales.  Abdominal:     General: There is no distension.     Palpations: Abdomen is soft.     Tenderness: There is no abdominal tenderness.  Musculoskeletal:        General: No deformity.     Cervical back: Neck supple.     Right lower leg: No edema.      Left lower leg: No edema.  Lymphadenopathy:     Cervical: No cervical adenopathy.  Skin:    General: Skin is warm and dry.     Findings: No rash.  Neurological:     Mental Status: She is alert and oriented  to person, place, and time. Mental status is at baseline.     Sensory: No sensory deficit.     Motor: No weakness.     Gait: Gait normal.  Psychiatric:        Mood and Affect: Mood normal.        Behavior: Behavior normal.        Thought Content: Thought content normal.    Diabetic Foot Exam - Simple   Simple Foot Form Diabetic Foot exam was performed with the following findings: Yes 12/18/2020  9:11 AM  Visual Inspection No deformities, no ulcerations, no other skin breakdown bilaterally: Yes Sensation Testing Intact to touch and monofilament testing bilaterally: Yes Pulse Check Posterior Tibialis and Dorsalis pulse intact bilaterally: Yes Comments      Last depression screening scores PHQ 2/9 Scores 12/18/2020 12/13/2019 11/30/2018  PHQ - 2 Score 0 0 0  PHQ- 9 Score 0 2 3   Last fall risk screening Fall Risk  12/18/2020  Falls in the past year? 0  Number falls in past yr: 0  Injury with Fall? 0  Risk for fall due to : No Fall Risks  Follow up Falls evaluation completed   Last Audit-C alcohol use screening Alcohol Use Disorder Test (AUDIT) 12/18/2020  1. How often do you have a drink containing alcohol? 2  2. How many drinks containing alcohol do you have on a typical day when you are drinking? 0  3. How often do you have six or more drinks on one occasion? 0  AUDIT-C Score 2   A score of 3 or more in women, and 4 or more in men indicates increased risk for alcohol abuse, EXCEPT if all of the points are from question 1   No results found for any visits on 12/18/20.  Assessment & Plan    Routine Health Maintenance and Physical Exam  Exercise Activities and Dietary recommendations  Goals   None     Immunization History  Administered Date(s) Administered    IPV 10/26/1973, 11/23/1973, 12/21/1973, 08/09/1991   Influenza,inj,Quad PF,6+ Mos 03/17/2017   Influenza-Unspecified 03/22/2014, 03/22/2016, 03/21/2018, 03/26/2020   MMR 07/04/1985, 08/09/1991, 02/07/1992   Pneumococcal Polysaccharide-23 04/02/2014   Tdap 12/07/2011    Health Maintenance  Topic Date Due   COVID-19 Vaccine (1) Never done   Zoster Vaccines- Shingrix (1 of 2) Never done   INFLUENZA VACCINE  01/20/2021   HEMOGLOBIN A1C  03/13/2021   OPHTHALMOLOGY EXAM  04/06/2021   TETANUS/TDAP  12/06/2021   FOOT EXAM  12/18/2021   Fecal DNA (Cologuard)  02/03/2022   MAMMOGRAM  07/17/2022   PAP SMEAR-Modifier  12/12/2024   PNEUMOCOCCAL POLYSACCHARIDE VACCINE AGE 50-64 HIGH RISK  Completed   Hepatitis C Screening  Completed   HIV Screening  Completed   Pneumococcal Vaccine 66-82 Years old  Aged Out   HPV VACCINES  Aged Out    Discussed health benefits of physical activity, and encouraged her to engage in regular exercise appropriate for her age and condition.  Problem List Items Addressed This Visit       Endocrine   Type 2 diabetes mellitus without complication, with long-term current use of insulin ([place])    Previously with slightly elevated A1c Continue current meds Move insulin dose to morning to avoid AM lows Foot exam today Otherwise UTD on screenings/vaccines On ACEi and statin Repeat A1c F/u in 6 m       Relevant Medications   metFORMIN (GLUCOPHAGE) 1000 MG tablet   lisinopril (  ZESTRIL) 10 MG tablet   empagliflozin (JARDIANCE) 10 MG TABS tablet   Other Relevant Orders   Comprehensive metabolic panel   Lipid panel   Hemoglobin A1c   POCT UA - Microalbumin   Hyperlipidemia associated with type 2 diabetes mellitus ([place])    Previously well controlled Continue statin Repeat FLP and CMP Goal LDL < 70       Relevant Medications   metFORMIN (GLUCOPHAGE) 1000 MG tablet   lisinopril (ZESTRIL) 10 MG tablet   empagliflozin (JARDIANCE) 10 MG TABS tablet   Other  Relevant Orders   Comprehensive metabolic panel   Lipid panel   Other Visit Diagnoses     Annual physical exam    -  Primary   Relevant Orders   CBC with Differential/Platelet   Comprehensive metabolic panel   Lipid panel   Hemoglobin A1c   TSH   Hair loss       Relevant Orders   CBC with Differential/Platelet   Comprehensive metabolic panel   TSH        Return in about 6 months (around 06/19/2021) for chronic disease f/u.     I, Lavon Paganini, MD, have reviewed all documentation for this visit. The documentation on 12/18/20 for the exam, diagnosis, procedures, and orders are all accurate and complete.   Jyasia Markoff, Dionne Bucy, MD, MPH [facility]

## 2020-12-18 NOTE — Patient Instructions (Signed)
Preventive Care 51-51 Years Old, Female Preventive care refers to lifestyle choices and visits with your health care provider that can promote health and wellness. This includes: A yearly physical exam. This is also called an annual wellness visit. Regular dental and eye exams. Immunizations. Screening for certain conditions. Healthy lifestyle choices, such as: Eating a healthy diet. Getting regular exercise. Not using drugs or products that contain nicotine and tobacco. Limiting alcohol use. What can I expect for my preventive care visit? Physical exam Your health care provider will check your: Height and weight. These may be used to calculate your BMI (body mass index). BMI is a measurement that tells if you are at a healthy weight. Heart rate and blood pressure. Body temperature. Skin for abnormal spots. Counseling Your health care provider may ask you questions about your: Past medical problems. Family's medical history. Alcohol, tobacco, and drug use. Emotional well-being. Home life and relationship well-being. Sexual activity. Diet, exercise, and sleep habits. Work and work Statistician. Access to firearms. Method of birth control. Menstrual cycle. Pregnancy history. What immunizations do I need?  Vaccines are usually given at various ages, according to a schedule. Your health care provider will recommend vaccines for you based on your age, medicalhistory, and lifestyle or other factors, such as travel or where you work. What tests do I need? Blood tests Lipid and cholesterol levels. These may be checked every 5 years, or more often if you are over 51 years old. Hepatitis C test. Hepatitis B test. Screening Lung cancer screening. You may have this screening every year starting at age 30 if you have a 30-pack-year history of smoking and currently smoke or have quit within the past 15 years. Colorectal cancer screening. All adults should have this screening starting at  age 23 and continuing until age 51. Your health care provider may recommend screening at age 51 if you are at increased risk. You will have tests every 1-10 years, depending on your results and the type of screening test. Diabetes screening. This is done by checking your blood sugar (glucose) after you have not eaten for a while (fasting). You may have this done every 1-3 years. Mammogram. This may be done every 1-2 years. Talk with your health care provider about when you should start having regular mammograms. This may depend on whether you have a family history of breast cancer. BRCA-related cancer screening. This may be done if you have a family history of breast, ovarian, tubal, or peritoneal cancers. Pelvic exam and Pap test. This may be done every 3 years starting at age 51. Starting at age 51, this may be done every 5 years if you have a Pap test in combination with an HPV test. Other tests STD (sexually transmitted disease) testing, if you are at risk. Bone density scan. This is done to screen for osteoporosis. You may have this scan if you are at high risk for osteoporosis. Talk with your health care provider about your test results, treatment options,and if necessary, the need for more tests. Follow these instructions at home: Eating and drinking  Eat a diet that includes fresh fruits and vegetables, whole grains, lean protein, and low-fat dairy products. Take vitamin and mineral supplements as recommended by your health care provider. Do not drink alcohol if: Your health care provider tells you not to drink. You are pregnant, may be pregnant, or are planning to become pregnant. If you drink alcohol: Limit how much you have to 0-1 drink a day. Be aware  of how much alcohol is in your drink. In the [place] one drink equals one 12 oz bottle of beer (355 mL), one 5 oz glass of wine (148 mL), or one 1 oz glass of hard liquor (44 mL).  Lifestyle Take daily care of your teeth and  gums. Brush your teeth every morning and night with fluoride toothpaste. Floss one time each day. Stay active. Exercise for at least 30 minutes 5 or more days each week. Do not use any products that contain nicotine or tobacco, such as cigarettes, e-cigarettes, and chewing tobacco. If you need help quitting, ask your health care provider. Do not use drugs. If you are sexually active, practice safe sex. Use a condom or other form of protection to prevent STIs (sexually transmitted infections). If you do not wish to become pregnant, use a form of birth control. If you plan to become pregnant, see your health care provider for a prepregnancy visit. If told by your health care provider, take low-dose aspirin daily starting at age 51. Find healthy ways to cope with stress, such as: Meditation, yoga, or listening to music. Journaling. Talking to a trusted person. Spending time with friends and family. Safety Always wear your seat belt while driving or riding in a vehicle. Do not drive: If you have been drinking alcohol. Do not ride with someone who has been drinking. When you are tired or distracted. While texting. Wear a helmet and other protective equipment during sports activities. If you have firearms in your house, make sure you follow all gun safety procedures. What's next? Visit your health care provider once a year for an annual wellness visit. Ask your health care provider how often you should have your eyes and teeth checked. Stay up to date on all vaccines. This information is not intended to replace advice given to you by your health care provider. Make sure you discuss any questions you have with your healthcare provider. Document Revised: 03/12/2020 Document Reviewed: 02/17/2018 Elsevier Patient Education  2022 [employer].

## 2020-12-19 ENCOUNTER — Other Ambulatory Visit: Payer: Self-pay

## 2020-12-19 LAB — COMPREHENSIVE METABOLIC PANEL
ALT: 16 IU/L (ref 0–32)
AST: 18 IU/L (ref 0–40)
Albumin/Globulin Ratio: 1.4 (ref 1.2–2.2)
Albumin: 4.2 g/dL (ref 3.8–4.9)
Alkaline Phosphatase: 59 IU/L (ref 44–121)
BUN/Creatinine Ratio: 20 (ref 9–23)
BUN: 12 mg/dL (ref 6–24)
Bilirubin Total: 0.3 mg/dL (ref 0.0–1.2)
CO2: 29 mmol/L (ref 20–29)
Calcium: 10.1 mg/dL (ref 8.7–10.2)
Chloride: 99 mmol/L (ref 96–106)
Creatinine, Ser: 0.61 mg/dL (ref 0.57–1.00)
Globulin, Total: 3.1 g/dL (ref 1.5–4.5)
Glucose: 82 mg/dL (ref 65–99)
Potassium: 5.2 mmol/L (ref 3.5–5.2)
Sodium: 144 mmol/L (ref 134–144)
Total Protein: 7.3 g/dL (ref 6.0–8.5)
eGFR: 108 mL/min/{1.73_m2} (ref >59)

## 2020-12-19 LAB — CBC WITH DIFFERENTIAL/PLATELET
Basophils Absolute: 0 10*3/uL (ref 0.0–0.2)
Basos: 1 %
EOS (ABSOLUTE): 0.3 10*3/uL (ref 0.0–0.4)
Eos: 5 %
Hematocrit: 42.4 % (ref 34.0–46.6)
Hemoglobin: 13.6 g/dL (ref 11.1–15.9)
Immature Grans (Abs): 0 10*3/uL (ref 0.0–0.1)
Immature Granulocytes: 0 %
Lymphocytes Absolute: 2.8 10*3/uL (ref 0.7–3.1)
Lymphs: 45 %
MCH: 28 pg (ref 26.6–33.0)
MCHC: 32.1 g/dL (ref 31.5–35.7)
MCV: 87 fL (ref 79–97)
Monocytes Absolute: 0.5 10*3/uL (ref 0.1–0.9)
Monocytes: 8 %
Neutrophils Absolute: 2.5 10*3/uL (ref 1.4–7.0)
Neutrophils: 41 %
Platelets: 284 10*3/uL (ref 150–450)
RBC: 4.86 x10E6/uL (ref 3.77–5.28)
RDW: 12.8 % (ref 11.7–15.4)
WBC: 6.2 10*3/uL (ref 3.4–10.8)

## 2020-12-19 LAB — LIPID PANEL
Chol/HDL Ratio: 2.6 ratio (ref 0.0–4.4)
Cholesterol, Total: 152 mg/dL (ref 100–199)
HDL: 58 mg/dL (ref >39)
LDL Chol Calc (NIH): 86 mg/dL (ref 0–99)
Triglycerides: 34 mg/dL (ref 0–149)
VLDL Cholesterol Cal: 8 mg/dL (ref 5–40)

## 2020-12-19 LAB — HEMOGLOBIN A1C
Est. average glucose Bld gHb Est-mCnc: 169 mg/dL
Hgb A1c MFr Bld: 7.5 % — ABNORMAL HIGH (ref 4.8–5.6)

## 2020-12-19 LAB — TSH: TSH: 1.64 u[IU]/mL (ref 0.450–4.500)

## 2020-12-26 ENCOUNTER — Other Ambulatory Visit (HOSPITAL_COMMUNITY): Payer: Self-pay

## 2020-12-27 ENCOUNTER — Other Ambulatory Visit (HOSPITAL_COMMUNITY): Payer: Self-pay

## 2020-12-30 ENCOUNTER — Other Ambulatory Visit: Payer: Self-pay

## 2021-01-10 ENCOUNTER — Other Ambulatory Visit: Payer: Self-pay

## 2021-01-10 MED FILL — Atorvastatin Calcium Tab 40 MG (Base Equivalent): ORAL | 90 days supply | Qty: 90 | Fill #1 | Status: AC

## 2021-01-10 MED FILL — Insulin Detemir Soln Pen-injector 100 Unit/ML: SUBCUTANEOUS | 90 days supply | Qty: 15 | Fill #0 | Status: AC

## 2021-01-31 ENCOUNTER — Other Ambulatory Visit (HOSPITAL_COMMUNITY): Payer: Self-pay

## 2021-02-05 ENCOUNTER — Other Ambulatory Visit: Payer: Self-pay

## 2021-02-05 ENCOUNTER — Other Ambulatory Visit: Payer: Self-pay | Admitting: Family Medicine

## 2021-02-05 NOTE — Telephone Encounter (Signed)
Medication Refill - Medication: empagliflozin (JARDIANCE) 10 MG TABS tablet  Has the patient contacted their pharmacy? Yes.   (Agent: If no, request that the patient contact the pharmacy for the refill.) (Agent: If yes, when and what did the pharmacy advise?)  Preferred Pharmacy (with phone number or street name):  [facility]  [place] [address]  Phone: [phone] Fax: [phone]    Agent: Please be advised that RX refills may take up to 3 business days. We ask that you follow-up with your pharmacy.

## 2021-02-06 ENCOUNTER — Other Ambulatory Visit: Payer: Self-pay

## 2021-02-06 MED ORDER — EMPAGLIFLOZIN 10 MG PO TABS
ORAL_TABLET | Freq: Every day | ORAL | 3 refills | Status: DC
  Filled 2021-02-06: qty 90, 90d supply, fill #0
  Filled 2021-05-11: qty 90, 90d supply, fill #1
  Filled 2021-08-07: qty 90, 90d supply, fill #2
  Filled 2021-10-29: qty 90, 90d supply, fill #3

## 2021-02-06 NOTE — Addendum Note (Signed)
Addended by: Virginia Crews on: 02/06/2021 08:52 AM   Modules accepted: Orders

## 2021-02-06 NOTE — Telephone Encounter (Signed)
  Notes to clinic:  Pharmacy states that this script was d/c on 12/18/2020 Per office note it states to continue on current medications Review for refill    Requested Prescriptions  Pending Prescriptions Disp Refills   JARDIANCE 10 MG TABS tablet [Pharmacy Med Name: empagliflozin (JARDIANCE) 10 MG Tab tablet] 30 tablet 5    Sig: TAKE 1 TABLET (10 MG TOTAL) BY MOUTH DAILY BEFORE BREAKFAST.     Endocrinology:  Diabetes - SGLT2 Inhibitors Failed - 02/05/2021  4:44 PM      Failed - AA eGFR in normal range and within 360 days    GFR calc Af Amer  Date Value Ref Range Status  12/13/2019 115 >59 mL/min/1.73 Final    Comment:    **[facility] currently reports eGFR in compliance with the current**   recommendations of the [employer]. [facility] will   update reporting as new guidelines are published from the NKF-ASN   Task force.    GFR calc non Af Amer  Date Value Ref Range Status  12/13/2019 100 >59 mL/min/1.73 Final   eGFR  Date Value Ref Range Status  12/18/2020 108 >59 mL/min/1.73 Final          Passed - Cr in normal range and within 360 days    Creatinine, Ser  Date Value Ref Range Status  12/18/2020 0.61 0.57 - 1.00 mg/dL Final          Passed - LDL in normal range and within 360 days    LDL Chol Calc (NIH)  Date Value Ref Range Status  12/18/2020 86 0 - 99 mg/dL Final          Passed - HBA1C is between 0 and 7.9 and within 180 days    Hgb A1c MFr Bld  Date Value Ref Range Status  12/18/2020 7.5 (H) 4.8 - 5.6 % Final    Comment:             Prediabetes: 5.7 - 6.4          Diabetes: >6.4          Glycemic control for adults with diabetes: <7.0           Passed - Valid encounter within last 6 months    Recent Outpatient Visits           1 month ago Annual physical exam   [facility], Dionne Bucy, MD   4 months ago Type 2 diabetes mellitus with hyperglycemia, with long-term current use of insulin [facility])   [facility], Dionne Bucy, MD   10 months ago Type 2 diabetes mellitus without complication, with long-term current use of insulin [facility])   [facility], Dionne Bucy, MD   1 year ago Annual physical exam   [facility], Dionne Bucy, MD   1 year ago Type 2 diabetes mellitus without complication, with long-term current use of insulin [facility])   [facility]igal[facility], Dionne Bucy, MD       Future Appointments             In 3 months Bacigalupo, Dionne Bucy, MD [facility], [facility]

## 2021-02-06 NOTE — Telephone Encounter (Deleted)
  Notes to clinic:  Review for refill Looks like script was filled on 12/18/2020 but doesn't show a dose    Requested Prescriptions  Pending Prescriptions Disp Refills   JARDIANCE 10 MG TABS tablet [Pharmacy Med Name: empagliflozin (JARDIANCE) 10 MG Tab tablet] 30 tablet 5    Sig: TAKE 1 TABLET (10 MG TOTAL) BY MOUTH DAILY BEFORE BREAKFAST.     Endocrinology:  Diabetes - SGLT2 Inhibitors Failed - 02/05/2021  4:44 PM      Failed - AA eGFR in normal range and within 360 days    GFR calc Af Amer  Date Value Ref Range Status  12/13/2019 115 >59 mL/min/1.73 Final    Comment:    **[facility] currently reports eGFR in compliance with the current**   recommendations of the [employer]. [facility] will   update reporting as new guidelines are published from the NKF-ASN   Task force.    GFR calc non Af Amer  Date Value Ref Range Status  12/13/2019 100 >59 mL/min/1.73 Final   eGFR  Date Value Ref Range Status  12/18/2020 108 >59 mL/min/1.73 Final          Passed - Cr in normal range and within 360 days    Creatinine, Ser  Date Value Ref Range Status  12/18/2020 0.61 0.57 - 1.00 mg/dL Final          Passed - LDL in normal range and within 360 days    LDL Chol Calc (NIH)  Date Value Ref Range Status  12/18/2020 86 0 - 99 mg/dL Final          Passed - HBA1C is between 0 and 7.9 and within 180 days    Hgb A1c MFr Bld  Date Value Ref Range Status  12/18/2020 7.5 (H) 4.8 - 5.6 % Final    Comment:             Prediabetes: 5.7 - 6.4          Diabetes: >6.4          Glycemic control for adults with diabetes: <7.0           Passed - Valid encounter within last 6 months    Recent Outpatient Visits           1 month ago Annual physical exam   [facility] [place], Dionne Bucy, MD   4 months ago Type 2 diabetes mellitus with hyperglycemia, with long-term current use of insulin [facility])   [facility], Dionne Bucy, MD   10 months ago  Type 2 diabetes mellitus without complication, with long-term current use of insulin [facility])   [facility], Dionne Bucy, MD   1 year ago Annual physical exam   [facility] [place], Dionne Bucy, MD   1 year ago Type 2 diabetes mellitus without complication, with long-term current use of insulin [facility])   [facility] Bacigalupo, Dionne Bucy, MD       Future Appointments             In 3 months Bacigalupo, Dionne Bucy, MD [facility], [facility]

## 2021-02-07 ENCOUNTER — Other Ambulatory Visit: Payer: Self-pay

## 2021-03-17 ENCOUNTER — Ambulatory Visit: Payer: Self-pay

## 2021-03-17 NOTE — Telephone Encounter (Addendum)
Pt. Reports she is currently out of town. 2 days ago started having chest pain. Left upper chest area. Discomfort "is always there, but worse with movement." Pain does not radiate, no other symptoms. Reports history of pneumothorax. Requests appointment next Wednesday 03/26/21,on her day off. No availability per [place] in the practice "until 04/01/21." Pt. Informed of this. Will send to provider to see if pt. Can be worked in. Pt. Will go to ED for worsening of symptoms.    Reason for Disposition  [1] Chest pain lasts > 5 minutes AND [2] occurred in past 3 days (72 hours) (Exception: feels exactly the same as previously diagnosed heartburn and has accompanying sour taste in mouth)  Answer Assessment - Initial Assessment Questions 1. LOCATION: "Where does it hurt?"       Left upper 2. RADIATION: "Does the pain go anywhere else?" (e.g., into neck, jaw, arms, back)     No 3. ONSET: "When did the chest pain begin?" (Minutes, hours or days)      2 days ago 4. PATTERN "Does the pain come and go, or has it been constant since it started?"  "Does it get worse with exertion?"      Comes and goes 5. DURATION: "How long does it last" (e.g., seconds, minutes, hours)     Worse with movement 6. SEVERITY: "How bad is the pain?"  (e.g., Scale 1-10; mild, moderate, or severe)    - MILD (1-3): doesn't interfere with normal activities     - MODERATE (4-7): interferes with normal activities or awakens from sleep    - SEVERE (8-10): excruciating pain, unable to do any normal activities       5 7. CARDIAC RISK FACTORS: "Do you have any history of heart problems or risk factors for heart disease?" (e.g., angina, prior heart attack; diabetes, high blood pressure, high cholesterol, smoker, or strong family history of heart disease)     No 8. PULMONARY RISK FACTORS: "Do you have any history of lung disease?"  (e.g., blood clots in lung, asthma, emphysema, birth control pills)     No 9. CAUSE: "What do you think is  causing the chest pain?"     Unsure 10. OTHER SYMPTOMS: "Do you have any other symptoms?" (e.g., dizziness, nausea, vomiting, sweating, fever, difficulty breathing, cough)       No 11. PREGNANCY: "Is there any chance you are pregnant?" "When was your last menstrual period?"       No  Protocols used: Chest Pain-A-AH

## 2021-03-17 NOTE — Telephone Encounter (Signed)
I am out of the office next week, but can see if anyone else has any availability. 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

## 2021-03-17 NOTE — Telephone Encounter (Signed)
lmtcb

## 2021-03-18 NOTE — Telephone Encounter (Signed)
Lmtcb.

## 2021-03-20 NOTE — Telephone Encounter (Signed)
Mychart message sent.

## 2021-03-20 NOTE — Telephone Encounter (Signed)
Maybe send a mychart message. FYI to Judson Roch. This is the patient that you asked about [place] working in

## 2021-04-01 ENCOUNTER — Other Ambulatory Visit (HOSPITAL_COMMUNITY): Payer: Self-pay

## 2021-04-09 ENCOUNTER — Other Ambulatory Visit: Payer: Self-pay | Admitting: Family Medicine

## 2021-04-09 ENCOUNTER — Other Ambulatory Visit: Payer: Self-pay

## 2021-04-09 MED FILL — Atorvastatin Calcium Tab 40 MG (Base Equivalent): ORAL | 90 days supply | Qty: 90 | Fill #2 | Status: AC

## 2021-04-09 NOTE — Telephone Encounter (Signed)
Requested medications are due for refill today.  yes  Requested medications are on the active medications list.  No, a different brand is.  Last refill. na  Future visit scheduled.   yes  Notes to clinic.  Rx needs to be changed to test strips that fit the machine pt has.

## 2021-04-10 ENCOUNTER — Other Ambulatory Visit: Payer: Self-pay

## 2021-04-10 ENCOUNTER — Other Ambulatory Visit: Payer: Self-pay | Admitting: Family Medicine

## 2021-04-10 NOTE — Telephone Encounter (Signed)
Requested medication (s) are due for refill today: Yes  Requested medication (s) are on the active medication list: Yes  Last refill:  03/17/17  Future visit scheduled: Yes  Notes to clinic:  Historical provider. Prescription expired.    Requested Prescriptions  Pending Prescriptions Disp Refills   FREESTYLE LITE test strip [Pharmacy Med Name: glucose blood test strip] 100 strip 12    Sig: USE AS INSTRUCTED TO CHECK BLOOD GLUCOSE ONCE DAILY     Endocrinology: Diabetes - Testing Supplies Passed - 04/10/2021  7:45 AM      Passed - Valid encounter within last 12 months    Recent Outpatient Visits           3 months ago Annual physical exam   [facility] [place], Dionne Bucy, MD   7 months ago Type 2 diabetes mellitus with hyperglycemia, with long-term current use of insulin [facility])   [facility], Dionne Bucy, MD   1 year ago Type 2 diabetes mellitus without complication, with long-term current use of insulin [facility])   [facility], Dionne Bucy, MD   1 year ago Annual physical exam   [facility], Dionne Bucy, MD   1 year ago Type 2 diabetes mellitus without complication, with long-term current use of insulin [facility])   [place], Dionne Bucy, MD       Future Appointments             In 1 month Bacigalupo, Dionne Bucy, MD [facility]

## 2021-04-11 ENCOUNTER — Other Ambulatory Visit: Payer: Self-pay

## 2021-04-11 MED FILL — Glucose Blood Test Strip: 90 days supply | Qty: 100 | Fill #0 | Status: CN

## 2021-04-22 ENCOUNTER — Other Ambulatory Visit: Payer: Self-pay

## 2021-05-07 ENCOUNTER — Other Ambulatory Visit: Payer: Self-pay

## 2021-05-12 ENCOUNTER — Other Ambulatory Visit: Payer: Self-pay

## 2021-06-04 ENCOUNTER — Encounter: Payer: Self-pay | Admitting: Family Medicine

## 2021-06-04 ENCOUNTER — Ambulatory Visit: Payer: 59 | Admitting: Family Medicine

## 2021-06-04 ENCOUNTER — Other Ambulatory Visit: Payer: Self-pay

## 2021-06-04 VITALS — BP 118/68 | HR 96 | Temp 97.6°F | Resp 16 | Ht 67.0 in | Wt 122.0 lb

## 2021-06-04 DIAGNOSIS — R232 Flushing: Secondary | ICD-10-CM

## 2021-06-04 DIAGNOSIS — E1169 Type 2 diabetes mellitus with other specified complication: Secondary | ICD-10-CM | POA: Diagnosis not present

## 2021-06-04 DIAGNOSIS — Z803 Family history of malignant neoplasm of breast: Secondary | ICD-10-CM | POA: Diagnosis not present

## 2021-06-04 DIAGNOSIS — Z794 Long term (current) use of insulin: Secondary | ICD-10-CM

## 2021-06-04 DIAGNOSIS — E11649 Type 2 diabetes mellitus with hypoglycemia without coma: Secondary | ICD-10-CM | POA: Diagnosis not present

## 2021-06-04 DIAGNOSIS — Z1231 Encounter for screening mammogram for malignant neoplasm of breast: Secondary | ICD-10-CM

## 2021-06-04 DIAGNOSIS — E785 Hyperlipidemia, unspecified: Secondary | ICD-10-CM | POA: Diagnosis not present

## 2021-06-04 DIAGNOSIS — Z79890 Hormone replacement therapy (postmenopausal): Secondary | ICD-10-CM | POA: Insufficient documentation

## 2021-06-04 LAB — POCT GLYCOSYLATED HEMOGLOBIN (HGB A1C)
Est. average glucose Bld gHb Est-mCnc: 128
Hemoglobin A1C: 6.1 % — AB (ref 4.0–5.6)

## 2021-06-04 NOTE — Assessment & Plan Note (Signed)
Previously well controlled Continue statin Repeat FLP and CMP at CPE in 6/23

## 2021-06-04 NOTE — Progress Notes (Signed)
Established patient visit   Patient: Lynn Coleman   DOB: [birth year]   50 y.o. Female  MRN: [id number] Visit Date: 06/04/2021  Today's healthcare provider: Lavon Paganini, MD   Chief Complaint  Patient presents with   Diabetes   I,Sulibeya S Dimas,acting as a scribe for Lavon Paganini, MD.,have documented all relevant documentation on the behalf of Lavon Paganini, MD,as directed by  Lavon Paganini, MD while in the presence of Lavon Paganini, MD.  Subjective    HPI  Diabetes Mellitus Type II, follow-up  Lab Results  Component Value Date   HGBA1C 6.1 (A) 06/04/2021   HGBA1C 7.5 (H) 12/18/2020   HGBA1C 7.2 (A) 09/10/2020   Last seen for diabetes 6 months ago.  Management since then includes continuing the same treatment. She reports excellent compliance with treatment. She is not having side effects.   Home blood sugar records: fasting range: 60-80s  Episodes of hypoglycemia? Yes   Current insulin regiment: Levemir 11-12 units daily Most Recent Eye Exam:   --------------------------------------------------------------------------------------------------- Lipid/Cholesterol, follow-up  Last Lipid Panel: Lab Results  Component Value Date   CHOL 152 12/18/2020   LDLCALC 86 12/18/2020   HDL 58 12/18/2020   TRIG 34 12/18/2020    She was last seen for this 6 months ago.  Management since that visit includes no changes.  She reports excellent compliance with treatment. She is not having side effects.   Symptoms: No appetite changes No foot ulcerations  No chest pain No chest pressure/discomfort  No dyspnea No orthopnea  No fatigue No lower extremity edema  No palpitations No paroxysmal nocturnal dyspnea  No nausea No numbness or tingling of extremity  No polydipsia No polyuria  No speech difficulty No syncope   She is following a Regular diet. Current exercise: no regular exercise  Last metabolic panel Lab Results  Component Value  Date   GLUCOSE 82 12/18/2020   NA 144 12/18/2020   K 5.2 12/18/2020   BUN 12 12/18/2020   CREATININE 0.61 12/18/2020   EGFR 108 12/18/2020   GFRNONAA 100 12/13/2019   CALCIUM 10.1 12/18/2020   AST 18 12/18/2020   ALT 16 12/18/2020   The 10-year ASCVD risk score (Arnett DK, et al., 2019) is: 2.7%  --------------------------------------------------------------------------------------------------- Older sister recently diagnosed with breast cancer, She is considering stopping HRT in this setting  Medications: Outpatient Medications Prior to Visit  Medication Sig   atorvastatin (LIPITOR) 40 MG tablet TAKE 1 TABLET BY MOUTH DAILY.   Blood Glucose Monitoring Suppl (FREESTYLE LITE) DEVI Use to check blood glucose once daily   cetirizine (ZYRTEC) 10 MG tablet Take 10 mg by mouth daily.   clonazePAM (KLONOPIN) 0.5 MG tablet Take 1 tablet (0.5 mg total) by mouth 2 (two) times daily as needed.   empagliflozin (JARDIANCE) 10 MG TABS tablet TAKE 1 TABLET (10 MG TOTAL) BY MOUTH DAILY BEFORE BREAKFAST.   estradiol (ESTRACE) 1 MG tablet Take 1 tablet (1 mg total) by mouth daily.   glucose blood (FREESTYLE LITE) test strip To check blood glucose once daily   insulin detemir (LEVEMIR) 100 UNIT/ML FlexPen INJECT 11-12 UNITS INTO THE SKIN DAILY.   Lancets (FREESTYLE) lancets Use as instructed   lisinopril (ZESTRIL) 10 MG tablet TAKE 1 TABLET BY MOUTH DAILY.   metFORMIN (GLUCOPHAGE) 1000 MG tablet TAKE 1 TABLET BY MOUTH 2 TIMES DAILY WITH A MEAL.   Multiple Vitamin (MULTIVITAMIN) tablet Take 1 tablet by mouth daily.   progesterone (PROMETRIUM) 200 MG capsule  Take 1 capsule (200 mg total) by mouth daily. On calendar days 1-12 of each month   triamcinolone ointment (KENALOG) 0.5 % Apply 1 application topically 2 (two) times daily.   No facility-administered medications prior to visit.    Review of Systems  Constitutional:  Negative for activity change, appetite change, fatigue and unexpected weight  change.  Respiratory:  Negative for shortness of breath.   Cardiovascular:  Negative for chest pain, palpitations and leg swelling.       Objective    BP 118/68 (BP Location: Right Arm, Patient Position: Sitting, Cuff Size: Normal)    Pulse 96    Temp 97.6 F (36.4 C) (Temporal)    Resp 16    Ht 5' 7"  (1.702 m)    Wt 122 lb (55.3 kg)    SpO2 100%    BMI 19.11 kg/m  BP Readings from Last 3 Encounters:  06/04/21 118/68  12/18/20 106/73  09/10/20 114/82   Wt Readings from Last 3 Encounters:  06/04/21 122 lb (55.3 kg)  12/18/20 126 lb (57.2 kg)  09/10/20 129 lb 9.6 oz (58.8 kg)      Physical Exam Vitals reviewed.  Constitutional:      General: She is not in acute distress.    Appearance: Normal appearance. She is well-developed. She is not diaphoretic.  HENT:     Head: Normocephalic and atraumatic.  Eyes:     General: No scleral icterus.    Conjunctiva/sclera: Conjunctivae normal.  Neck:     Thyroid: No thyromegaly.  Cardiovascular:     Rate and Rhythm: Normal rate and regular rhythm.     Pulses: Normal pulses.     Heart sounds: Normal heart sounds. No murmur heard. Pulmonary:     Effort: Pulmonary effort is normal. No respiratory distress.     Breath sounds: Normal breath sounds. No wheezing, rhonchi or rales.  Musculoskeletal:     Cervical back: Neck supple.     Right lower leg: No edema.     Left lower leg: No edema.  Lymphadenopathy:     Cervical: No cervical adenopathy.  Skin:    General: Skin is warm and dry.     Findings: No rash.  Neurological:     Mental Status: She is alert and oriented to person, place, and time. Mental status is at baseline.  Psychiatric:        Mood and Affect: Mood normal.        Behavior: Behavior normal.      Results for orders placed or performed in visit on 06/04/21  POCT glycosylated hemoglobin (Hb A1C)  Result Value Ref Range   Hemoglobin A1C 6.1 (A) 4.0 - 5.6 %   Est. average glucose Bld gHb Est-mCnc 128     Assessment  & Plan     Problem List Items Addressed This Visit       Cardiovascular and Mediastinum   Hot flashes    Longstanding Previously worsened after stopping HRT Failed gabapentin in the past Continue estradiol progesterone at current doses See discussion below about consideration of using lower dose in the spring and next visit        Endocrine   T2DM (type 2 diabetes mellitus) ([place]) - Primary    A1c has decreased, but she is having hypoglycemic episodes Moving her insulin dose to the morning has helped some, but she does still have AM lows Decrease Levemir to 5 units daily and if continues to have hypoglycemia in the mornings, she  can discontinue Continue bedtime snack Consider increasing Jardiance to 25 mg daily at next visit pending A1c and whether or not she is still on insulin Up-to-date on screenings and vaccinations On ACE inhibitor and statin Upcoming eye exam      Relevant Orders   POCT glycosylated hemoglobin (Hb A1C) (Completed)   Hyperlipidemia associated with type 2 diabetes mellitus ([place])    Previously well controlled Continue statin Repeat FLP and CMP at CPE in 6/23        Other   Family history of breast cancer    Continue annual mammograms Consider tapering of HRT if possible  In [facility]I study: In the combined estrogen-progesterone arm breast cancer risk was increased by 1.26 (CI of 1.00 to 1.59), coronary heart disease 1.29 (CI 1.02-1.63), stroke risk 1.41 (1.07-1.85), and pulmonary embolism 2.13 (CI 1.39-3.25).  That being said the while statistically significant the actual number of cases attributable are relatively small at an addition 8 cases of breast cancer, 7 more coronary artery event, 8 more strokes, and 8 additional case of pulmonary embolism per 10,000 women.  Study was terminated because of the increased breast cancer risk, this was not seen in the progestin only arm of the study for women without an intact uterus.  In addition it is important to note  that HRT also had positive or risk reducing effects, and all cause mortality between the HRT/non-HRT users is not statistically different.  Estrogen-progestin HRT decreased the relative risk of hip fracture 0.66 (CI 0.45-0.98), colorectal cancer 0.63 (0.43-0.92).  Current consensus is to limit dose to the lowest effective dose, and shortest treatment duration possible.  Breast cancer risk appeared to increase after 4 years of use.       Hormone replacement therapy (HRT)    As above, may consider using half dose of current HRT at next visit to decrease breast cancer risk given new family history in her sister that is 42 years older than her.      Other Visit Diagnoses     Screening mammogram for breast cancer       Relevant Orders   MM 3D SCREEN BREAST BILATERAL        Return in about 3 months (around 09/02/2021) for chronic disease f/u.      I, Lavon Paganini, MD, have reviewed all documentation for this visit. The documentation on 06/04/21 for the exam, diagnosis, procedures, and orders are all accurate and complete.   Karyssa Amaral, Dionne Bucy, MD, MPH [facility]

## 2021-06-04 NOTE — Assessment & Plan Note (Signed)
Continue annual mammograms Consider tapering of HRT if possible  In [facility]I study: In the combined estrogen-progesterone arm breast cancer risk was increased by 1.26 (CI of 1.00 to 1.59), coronary heart disease 1.29 (CI 1.02-1.63), stroke risk 1.41 (1.07-1.85), and pulmonary embolism 2.13 (CI 1.39-3.25).  That being said the while statistically significant the actual number of cases attributable are relatively small at an addition 8 cases of breast cancer, 7 more coronary artery event, 8 more strokes, and 8 additional case of pulmonary embolism per 10,000 women.  Study was terminated because of the increased breast cancer risk, this was not seen in the progestin only arm of the study for women without an intact uterus.  In addition it is important to note that HRT also had positive or risk reducing effects, and all cause mortality between the HRT/non-HRT users is not statistically different.  Estrogen-progestin HRT decreased the relative risk of hip fracture 0.66 (CI 0.45-0.98), colorectal cancer 0.63 (0.43-0.92).  Current consensus is to limit dose to the lowest effective dose, and shortest treatment duration possible.  Breast cancer risk appeared to increase after 4 years of use.

## 2021-06-04 NOTE — Assessment & Plan Note (Signed)
Longstanding Previously worsened after stopping HRT Failed gabapentin in the past Continue estradiol progesterone at current doses See discussion below about consideration of using lower dose in the spring and next visit

## 2021-06-04 NOTE — Assessment & Plan Note (Signed)
A1c has decreased, but she is having hypoglycemic episodes Moving her insulin dose to the morning has helped some, but she does still have AM lows Decrease Levemir to 5 units daily and if continues to have hypoglycemia in the mornings, she can discontinue Continue bedtime snack Consider increasing Jardiance to 25 mg daily at next visit pending A1c and whether or not she is still on insulin Up-to-date on screenings and vaccinations On ACE inhibitor and statin Upcoming eye exam

## 2021-06-04 NOTE — Assessment & Plan Note (Signed)
As above, may consider using half dose of current HRT at next visit to decrease breast cancer risk given new family history in her sister that is 46 years older than her.

## 2021-06-06 ENCOUNTER — Other Ambulatory Visit (HOSPITAL_COMMUNITY): Payer: Self-pay

## 2021-06-08 ENCOUNTER — Encounter: Payer: Self-pay | Admitting: Family Medicine

## 2021-06-09 ENCOUNTER — Other Ambulatory Visit: Payer: Self-pay

## 2021-06-09 MED ORDER — PROGESTERONE MICRONIZED 100 MG PO CAPS
100.0000 mg | ORAL_CAPSULE | Freq: Every day | ORAL | 1 refills | Status: DC
  Filled 2021-06-09: qty 36, 84d supply, fill #0

## 2021-06-09 MED ORDER — ESTRADIOL 0.5 MG PO TABS
0.5000 mg | ORAL_TABLET | Freq: Every day | ORAL | 1 refills | Status: DC
  Filled 2021-06-09: qty 90, 90d supply, fill #0

## 2021-06-10 ENCOUNTER — Other Ambulatory Visit: Payer: Self-pay

## 2021-06-13 ENCOUNTER — Encounter: Payer: Self-pay | Admitting: Family Medicine

## 2021-07-09 ENCOUNTER — Other Ambulatory Visit: Payer: Self-pay | Admitting: Family Medicine

## 2021-07-09 ENCOUNTER — Other Ambulatory Visit: Payer: Self-pay

## 2021-07-09 MED FILL — Insulin Detemir Soln Pen-injector 100 Unit/ML: SUBCUTANEOUS | 90 days supply | Qty: 15 | Fill #1 | Status: AC

## 2021-07-09 MED FILL — Atorvastatin Calcium Tab 40 MG (Base Equivalent): ORAL | 90 days supply | Qty: 90 | Fill #0 | Status: AC

## 2021-07-09 MED FILL — Glucose Blood Test Strip: 90 days supply | Qty: 100 | Fill #0 | Status: AC

## 2021-07-09 NOTE — Telephone Encounter (Signed)
Requested Prescriptions  Pending Prescriptions Disp Refills   atorvastatin (LIPITOR) 40 MG tablet 90 tablet 0    Sig: TAKE 1 TABLET BY MOUTH DAILY.     Cardiovascular:  Antilipid - Statins Passed - 07/09/2021  2:17 PM      Passed - Total Cholesterol in normal range and within 360 days    Cholesterol, Total  Date Value Ref Range Status  12/18/2020 152 100 - 199 mg/dL Final         Passed - LDL in normal range and within 360 days    LDL Chol Calc (NIH)  Date Value Ref Range Status  12/18/2020 86 0 - 99 mg/dL Final         Passed - HDL in normal range and within 360 days    HDL  Date Value Ref Range Status  12/18/2020 58 >39 mg/dL Final         Passed - Triglycerides in normal range and within 360 days    Triglycerides  Date Value Ref Range Status  12/18/2020 34 0 - 149 mg/dL Final         Passed - Patient is not pregnant      Passed - Valid encounter within last 12 months    Recent Outpatient Visits          1 month ago Type 2 diabetes mellitus with hypoglycemia without coma, with long-term current use of insulin [facility])   [facility] [place], Dionne Bucy, MD   6 months ago Annual physical exam   [facility] [place], Dionne Bucy, MD   10 months ago Type 2 diabetes mellitus with hyperglycemia, with long-term current use of insulin [facility])   [facility] [place], Dionne Bucy, MD   1 year ago Type 2 diabetes mellitus without complication, with long-term current use of insulin [facility])   [facility], Dionne Bucy, MD   1 year ago Annual physical exam   [facility], Dionne Bucy, MD      Future Appointments            In 2 months Bacigalupo, Dionne Bucy, MD [facility] [place]

## 2021-07-10 ENCOUNTER — Other Ambulatory Visit: Payer: Self-pay

## 2021-07-16 DIAGNOSIS — H5213 Myopia, bilateral: Secondary | ICD-10-CM | POA: Diagnosis not present

## 2021-07-16 LAB — HM DIABETES EYE EXAM

## 2021-07-23 ENCOUNTER — Ambulatory Visit
Admission: RE | Admit: 2021-07-23 | Discharge: 2021-07-23 | Disposition: A | Discharge status: Home / Self Care | Payer: 59 | Source: Ambulatory Visit | Attending: Family Medicine | Admitting: Family Medicine

## 2021-07-23 ENCOUNTER — Other Ambulatory Visit: Payer: Self-pay

## 2021-07-23 DIAGNOSIS — Z1231 Encounter for screening mammogram for malignant neoplasm of breast: Secondary | ICD-10-CM | POA: Diagnosis not present

## 2021-08-07 ENCOUNTER — Other Ambulatory Visit: Payer: Self-pay

## 2021-08-13 ENCOUNTER — Other Ambulatory Visit: Payer: Self-pay

## 2021-08-13 MED ORDER — CARESTART COVID-19 HOME TEST VI KIT
PACK | 0 refills | Status: AC
  Filled 2021-08-13: qty 2, 4d supply, fill #0

## 2021-08-14 ENCOUNTER — Encounter: Payer: Self-pay | Admitting: Family Medicine

## 2021-08-15 ENCOUNTER — Telehealth: Payer: 59 | Admitting: Family Medicine

## 2021-08-15 ENCOUNTER — Other Ambulatory Visit: Payer: Self-pay

## 2021-08-15 ENCOUNTER — Encounter: Payer: Self-pay | Admitting: Family Medicine

## 2021-08-15 ENCOUNTER — Ambulatory Visit: Payer: Self-pay | Admitting: *Deleted

## 2021-08-15 VITALS — Wt 122.0 lb

## 2021-08-15 DIAGNOSIS — U071 COVID-19: Secondary | ICD-10-CM

## 2021-08-15 MED ORDER — NIRMATRELVIR/RITONAVIR (PAXLOVID)TABLET
3.0000 | ORAL_TABLET | Freq: Two times a day (BID) | ORAL | 0 refills | Status: AC
  Filled 2021-08-15: qty 30, 5d supply, fill #0

## 2021-08-15 NOTE — Progress Notes (Signed)
Established patient visit   Patient: Lynn Coleman   DOB: 05-29-1971   51 y.o. Female  MRN: [id number] Visit Date: 08/15/2021  Today's healthcare provider: Lelon Huh, MD   Chief Complaint  Patient presents with   Covid Positive   Subjective    HPI   Start mild chest and head congestion, sneezing, sore throat and low grade fever on 08/13/2021 Had positive Covid test that days Last night started having worsening muscle aches, fever has resolved. Still has mild cough, no dyspnea.   Medications: Outpatient Medications Prior to Visit  Medication Sig   atorvastatin (LIPITOR) 40 MG tablet TAKE 1 TABLET BY MOUTH DAILY.   Blood Glucose Monitoring Suppl (FREESTYLE LITE) DEVI Use to check blood glucose once daily   cetirizine (ZYRTEC) 10 MG tablet Take 10 mg by mouth daily.   clonazePAM (KLONOPIN) 0.5 MG tablet Take 1 tablet (0.5 mg total) by mouth 2 (two) times daily as needed.   COVID-19 At Home Antigen Test (CARESTART COVID-19 HOME TEST) KIT Use as directed   empagliflozin (JARDIANCE) 10 MG TABS tablet TAKE 1 TABLET (10 MG TOTAL) BY MOUTH DAILY BEFORE BREAKFAST.   estradiol (ESTRACE) 0.5 MG tablet Take 1 tablet (0.5 mg total) by mouth daily.   glucose blood (FREESTYLE LITE) test strip To check blood glucose once daily   insulin detemir (LEVEMIR) 100 UNIT/ML FlexPen INJECT 11-12 UNITS INTO THE SKIN DAILY.   Lancets (FREESTYLE) lancets Use as instructed   lisinopril (ZESTRIL) 10 MG tablet TAKE 1 TABLET BY MOUTH DAILY.   metFORMIN (GLUCOPHAGE) 1000 MG tablet TAKE 1 TABLET BY MOUTH 2 TIMES DAILY WITH A MEAL.   Multiple Vitamin (MULTIVITAMIN) tablet Take 1 tablet by mouth daily.   progesterone (PROMETRIUM) 100 MG capsule Take 1 capsule (100 mg total) by mouth daily. On calendar days 1-12 of each month   triamcinolone ointment (KENALOG) 0.5 % Apply 1 application topically 2 (two) times daily.   No facility-administered medications prior to visit.    Review of Systems      Objective    Wt 122 lb (55.3 kg) Comment: per chart   LMP 12/02/2017    BMI 19.11 kg/m    Physical Exam  Awake, alert, oriented x 3. In no apparent distress     Assessment & Plan     1. COVID-19  - nirmatrelvir/ritonavir EUA (PAXLOVID) 20 x 150 MG & 10 x 100MG TABS; Take 3 tablets by mouth 2 (two) times daily for 5 days. (Take nirmatrelvir 150 mg two tablets twice daily for 5 days and ritonavir 100 mg one tablet twice daily for 5 days) Patient GFR is 108.  STOP TAKING ATORVASTATIN FOR 10 DAYS  Dispense: 30 tablet; Refill: 0        The entirety of the information documented in the History of Present Illness, Review of Systems and Physical Exam were personally obtained by me. Portions of this information were initially documented by the CMA and reviewed by me for thoroughness and accuracy.     Lelon Huh, MD  [facility] [phone] (phone) [phone] (fax)  [place]

## 2021-08-15 NOTE — Telephone Encounter (Signed)
Pt called to advise she came down w/ covid .  She has congestion, runny nose. Body aches.  Would like Dr to send in plaxlovid to [place].  Pt would like to be notified when it is sent in.  Chief Complaint: covid Symptoms: resp, aches Frequency: inbetween IBU Pertinent Negatives: Patient denies resp difficulty, just symptoms Disposition: [] ED /[] Urgent Care (no appt availability in office) / [x] Appointment(In office/virtual)/ []  [place] [facility]/ [] Home Care/ [] Refused Recommended Disposition /[] [place] [facility]/ []  Follow-up with PCP Additional Notes: NA  Reason for Disposition  MILD difficulty breathing (e.g., minimal/no SOB at rest, SOB with walking, pulse <100)  Answer Assessment - Initial Assessment Questions 1. COVID-19 DIAGNOSIS: "Who made your COVID-19 diagnosis?" "Was it confirmed by a positive lab test or self-test?" If not diagnosed by a doctor (or NP/PA), ask "Are there lots of cases (community spread) where you live?" Note: See public health department website, if unsure.     Self 2. COVID-19 EXPOSURE: "Was there any known exposure to COVID before the symptoms began?" CDC Definition of close contact: within 6 feet (2 meters) for a total of 15 minutes or more over a 24-hour period.      Husband had it 6 days ago 3. ONSET: "When did the COVID-19 symptoms start?"      Wednesday 4. WORST SYMPTOM: "What is your worst symptom?" (e.g., cough, fever, shortness of breath, muscle aches)     Muscle aches 5. COUGH: "Do you have a cough?" If Yes, ask: "How bad is the cough?"       Little cough, sneezing, running nose, congestion, fatique 6. FEVER: "Do you have a fever?" If Yes, ask: "What is your temperature, how was it measured, and when did it start?"     Not since Thursday (taking Ibuprofen) 7. RESPIRATORY STATUS: "Describe your breathing?" (e.g., shortness of breath, wheezing, unable to speak)      Breath is ok 8. BETTER-SAME-WORSE: "Are you getting better, staying  the same or getting worse compared to yesterday?"  If getting worse, ask, "In what way?"     Feels like today is the worse 9. HIGH RISK DISEASE: "Do you have any chronic medical problems?" (e.g., asthma, heart or lung disease, weak immune system, obesity, etc.)     no 10. VACCINE: "Have you had the COVID-19 vaccine?" If Yes, ask: "Which one, how many shots, when did you get it?"       All vaccinations  11. BOOSTER: "Have you received your COVID-19 booster?" If Yes, ask: "Which one and when did you get it?"       yes 12. PREGNANCY: "Is there any chance you are pregnant?" "When was your last menstrual period?"       no 13. OTHER SYMPTOMS: "Do you have any other symptoms?"  (e.g., chills, fatigue, headache, loss of smell or taste, muscle pain, sore throat)       no 14. O2 SATURATION MONITOR:  "Do you use an oxygen saturation monitor (pulse oximeter) at home?" If Yes, ask "What is your reading (oxygen level) today?" "What is your usual oxygen saturation reading?" (e.g., 95%)       no  Protocols used: Coronavirus (COVID-19) Diagnosed or Suspected-A-AH

## 2021-09-03 ENCOUNTER — Ambulatory Visit: Payer: 59 | Admitting: Family Medicine

## 2021-09-09 NOTE — Progress Notes (Signed)
?  ? ?I,Joseline E Rosas,acting as a scribe for Lavon Paganini, MD.,have documented all relevant documentation on the behalf of Lavon Paganini, MD,as directed by  Lavon Paganini, MD while in the presence of Lavon Paganini, MD.  ? ?Established patient visit ? ? ?Patient: Lynn Coleman   DOB: [birth year]   51 y.o. Female  MRN: [id number] ?Visit Date: 09/10/2021 ? ?Today's healthcare provider: Lavon Paganini, MD  ? ?Chief Complaint  ?Patient presents with  ? Follow-up  ? ?Subjective  ?  ?HPI  ?Diabetes Mellitus Type II, Follow-up ? ?Lab Results  ?Component Value Date  ? HGBA1C 7.2 (A) 09/10/2021  ? HGBA1C 6.1 (A) 06/04/2021  ? HGBA1C 7.5 (H) 12/18/2020  ? ?Wt Readings from Last 3 Encounters:  ?09/10/21 119 lb 9.6 oz (54.3 kg)  ?08/15/21 122 lb (55.3 kg)  ?06/04/21 122 lb (55.3 kg)  ? ?Last seen for diabetes 3 months ago.  ?Management since then includes decrease Levemir to 5 units daily due to hypoglycemic episodes. ?She reports excellent compliance with treatment. ?She is not having side effects.  ? ?Home blood sugar records: fasting range: mostly 80-110 ? ?Episodes of hypoglycemia? Yes not very often but she is waking up to 80-110 ?  ?Current insulin regiment: levemir 5 units ?Most Recent Eye Exam: January 2023 ?Current exercise: walking and Ellipta ?Current diet habits: well balanced ? ?Pertinent Labs: ?Lab Results  ?Component Value Date  ? CHOL 152 12/18/2020  ? HDL 58 12/18/2020  ? [place] 86 12/18/2020  ? TRIG 34 12/18/2020  ? CHOLHDL 2.6 12/18/2020  ? Lab Results  ?Component Value Date  ? NA 144 12/18/2020  ? K 5.2 12/18/2020  ? CREATININE 0.61 12/18/2020  ? EGFR 108 12/18/2020  ? MICROALBUR 50 12/18/2020  ?  ? ?--------------------------------------------------------------------------------------------------- ?Hot flashes have increased overnight on lower dose of hormones.   ?More hair breakage and shedding. ? ?Medications: ?Outpatient Medications Prior to Visit  ?Medication Sig  ? atorvastatin  (LIPITOR) 40 MG tablet TAKE 1 TABLET BY MOUTH DAILY.  ? Blood Glucose Monitoring Suppl (FREESTYLE LITE) DEVI Use to check blood glucose once daily  ? cetirizine (ZYRTEC) 10 MG tablet Take 10 mg by mouth daily.  ? COVID-19 At Home Antigen Test (CARESTART COVID-19 HOME TEST) KIT Use as directed  ? empagliflozin (JARDIANCE) 10 MG TABS tablet TAKE 1 TABLET (10 MG TOTAL) BY MOUTH DAILY BEFORE BREAKFAST.  ? glucose blood (FREESTYLE LITE) test strip To check blood glucose once daily  ? insulin detemir (LEVEMIR) 100 UNIT/ML FlexPen INJECT 11-12 UNITS INTO THE SKIN DAILY.  ? Lancets (FREESTYLE) lancets Use as instructed  ? metFORMIN (GLUCOPHAGE) 1000 MG tablet TAKE 1 TABLET BY MOUTH 2 TIMES DAILY WITH A MEAL.  ? Multiple Vitamin (MULTIVITAMIN) tablet Take 1 tablet by mouth daily.  ? triamcinolone ointment (KENALOG) 0.5 % Apply 1 application topically 2 (two) times daily.  ? [DISCONTINUED] clonazePAM (KLONOPIN) 0.5 MG tablet Take 1 tablet (0.5 mg total) by mouth 2 (two) times daily as needed.  ? [DISCONTINUED] estradiol (ESTRACE) 0.5 MG tablet Take 1 tablet (0.5 mg total) by mouth daily.  ? [DISCONTINUED] lisinopril (ZESTRIL) 10 MG tablet TAKE 1 TABLET BY MOUTH DAILY.  ? [DISCONTINUED] progesterone (PROMETRIUM) 100 MG capsule Take 1 capsule (100 mg total) by mouth daily. On calendar days 1-12 of each month  ? ?No facility-administered medications prior to visit.  ? ? ?Review of Systems per HPI ? ? ?  Objective  ?  ?BP 118/80 (BP Location: Right Arm, Patient Position:  Sitting, Cuff Size: Normal)   Pulse 99   Temp 97.6 ?F (36.4 ?C) (Temporal)   Resp 16   Wt 119 lb 9.6 oz (54.3 kg)   LMP 12/02/2017   BMI 18.73 kg/m?  ? ? ?Physical Exam ?Vitals reviewed.  ?Constitutional:   ?   General: She is not in acute distress. ?   Appearance: Normal appearance. She is well-developed. She is not diaphoretic.  ?HENT:  ?   Head: Normocephalic and atraumatic.  ?Eyes:  ?   General: No scleral icterus. ?   Conjunctiva/sclera: Conjunctivae  normal.  ?Neck:  ?   Thyroid: No thyromegaly.  ?Cardiovascular:  ?   Rate and Rhythm: Normal rate and regular rhythm.  ?   Pulses: Normal pulses.  ?   Heart sounds: Normal heart sounds. No murmur heard. ?Pulmonary:  ?   Effort: Pulmonary effort is normal. No respiratory distress.  ?   Breath sounds: Normal breath sounds. No wheezing, rhonchi or rales.  ?Musculoskeletal:  ?   Cervical back: Neck supple.  ?   Right lower leg: No edema.  ?   Left lower leg: No edema.  ?Lymphadenopathy:  ?   Cervical: No cervical adenopathy.  ?Skin: ?   General: Skin is warm and dry.  ?   Findings: No rash.  ?Neurological:  ?   Mental Status: She is alert and oriented to person, place, and time. Mental status is at baseline.  ?Psychiatric:     ?   Mood and Affect: Mood normal.     ?   Behavior: Behavior normal.  ?  ? ? ?Results for orders placed or performed in visit on 09/10/21  ?POCT glycosylated hemoglobin (Hb A1C)  ?Result Value Ref Range  ? Hemoglobin A1C 7.2 (A) 4.0 - 5.6 %  ? Est. average glucose Bld gHb Est-mCnc 160   ? ? Assessment & Plan  ?  ? ?Problem List Items Addressed This Visit   ? ?  ? Endocrine  ? T2DM (type 2 diabetes mellitus) ([place]) - Primary  ?  A1c above goal, but still having rare hypoglycemic episodes ?Will use lifestyle management to improve ?Trial of CGM ?Continue current medications ?UTD on vaccines, eye exam, foot exam ?On ACEi/ARB ?On Statin ?Discussed diet and exercise ?F/u in 3 months  ?  ?  ? Relevant Medications  ? lisinopril (ZESTRIL) 10 MG tablet  ? Other Relevant Orders  ? POCT glycosylated hemoglobin (Hb A1C) (Completed)  ?  ? Other  ? Anxiety  ?  Chronic and stable ?Using clonazepam rarely (1 rx per year) ?Refilled today ?  ?  ? Family history of breast cancer  ?  HRT dosing as above ?May f/u with GYN ?Consider genetic testing also ?  ?  ? Hormone replacement therapy (HRT)  ?  Worsening hair loss and hot flashes since decreasing dose of HRT after learning about fam hx of breast cancer ?Will go back  to original dose ?Patient has considered risks and benefits ?She is considering seeing GYN again also ?  ?  ?  ? ?Return in about 3 months (around 12/11/2021) for CPE, Wed AM ok.  ?   ? ?I, Lavon Paganini, MD, have reviewed all documentation for this visit. The documentation on 09/10/21 for the exam, diagnosis, procedures, and orders are all accurate and complete. ? ? ?Virginia Crews, MD, MPH ?[place] ?[facility]   ?

## 2021-09-10 ENCOUNTER — Encounter: Payer: Self-pay | Admitting: Family Medicine

## 2021-09-10 ENCOUNTER — Other Ambulatory Visit: Payer: Self-pay

## 2021-09-10 ENCOUNTER — Ambulatory Visit: Payer: 59 | Admitting: Family Medicine

## 2021-09-10 VITALS — BP 118/80 | HR 99 | Temp 97.6°F | Resp 16 | Wt 119.6 lb

## 2021-09-10 DIAGNOSIS — Z79890 Hormone replacement therapy (postmenopausal): Secondary | ICD-10-CM

## 2021-09-10 DIAGNOSIS — E11649 Type 2 diabetes mellitus with hypoglycemia without coma: Secondary | ICD-10-CM | POA: Diagnosis not present

## 2021-09-10 DIAGNOSIS — F419 Anxiety disorder, unspecified: Secondary | ICD-10-CM

## 2021-09-10 DIAGNOSIS — Z803 Family history of malignant neoplasm of breast: Secondary | ICD-10-CM

## 2021-09-10 DIAGNOSIS — Z794 Long term (current) use of insulin: Secondary | ICD-10-CM | POA: Diagnosis not present

## 2021-09-10 LAB — POCT GLYCOSYLATED HEMOGLOBIN (HGB A1C)
Est. average glucose Bld gHb Est-mCnc: 160
Hemoglobin A1C: 7.2 % — AB (ref 4.0–5.6)

## 2021-09-10 MED ORDER — CLONAZEPAM 0.5 MG PO TABS
0.5000 mg | ORAL_TABLET | Freq: Two times a day (BID) | ORAL | 1 refills | Status: DC | PRN
  Filled 2021-09-10: qty 30, 15d supply, fill #0

## 2021-09-10 MED ORDER — ESTRADIOL 1 MG PO TABS
1.0000 mg | ORAL_TABLET | Freq: Every day | ORAL | 1 refills | Status: DC
  Filled 2021-09-10: qty 90, 90d supply, fill #0
  Filled 2021-12-17: qty 90, 90d supply, fill #1

## 2021-09-10 MED ORDER — FREESTYLE LIBRE 3 SENSOR MISC
1.0000 | 5 refills | Status: DC
  Filled 2021-09-10: qty 2, 28d supply, fill #0
  Filled 2021-10-13: qty 2, 28d supply, fill #1
  Filled 2021-10-29: qty 2, 28d supply, fill #2
  Filled 2021-12-05: qty 2, 28d supply, fill #3
  Filled 2022-01-13: qty 2, 28d supply, fill #4
  Filled 2022-03-20: qty 2, 28d supply, fill #5

## 2021-09-10 MED ORDER — LISINOPRIL 10 MG PO TABS
ORAL_TABLET | Freq: Every day | ORAL | 3 refills | Status: DC
  Filled 2021-09-10: qty 90, fill #0
  Filled 2021-10-29: qty 90, 90d supply, fill #0
  Filled 2022-02-11 – 2022-02-18 (×2): qty 90, 90d supply, fill #1
  Filled 2022-05-27: qty 90, 90d supply, fill #2
  Filled 2022-08-19: qty 90, 90d supply, fill #3

## 2021-09-10 MED ORDER — FREESTYLE LIBRE READER DEVI
5 refills | Status: DC
  Filled 2021-09-10: qty 1, fill #0

## 2021-09-10 MED ORDER — PROGESTERONE 200 MG PO CAPS
200.0000 mg | ORAL_CAPSULE | Freq: Every day | ORAL | 3 refills | Status: DC
  Filled 2021-09-10: qty 45, 90d supply, fill #0
  Filled 2022-02-11 – 2022-02-18 (×2): qty 45, 90d supply, fill #1
  Filled 2022-05-27: qty 12, 30d supply, fill #2
  Filled 2022-07-22: qty 12, 30d supply, fill #3

## 2021-09-10 NOTE — Assessment & Plan Note (Signed)
HRT dosing as above ?May f/u with GYN ?Consider genetic testing also ?

## 2021-09-10 NOTE — Assessment & Plan Note (Signed)
Chronic and stable ?Using clonazepam rarely (1 rx per year) ?Refilled today ?

## 2021-09-10 NOTE — Assessment & Plan Note (Signed)
A1c above goal, but still having rare hypoglycemic episodes ?Will use lifestyle management to improve ?Trial of CGM ?Continue current medications ?UTD on vaccines, eye exam, foot exam ?On ACEi/ARB ?On Statin ?Discussed diet and exercise ?F/u in 3 months  ?

## 2021-09-10 NOTE — Assessment & Plan Note (Signed)
Worsening hair loss and hot flashes since decreasing dose of HRT after learning about fam hx of breast cancer ?Will go back to original dose ?Patient has considered risks and benefits ?She is considering seeing GYN again also ?

## 2021-09-17 ENCOUNTER — Other Ambulatory Visit: Payer: Self-pay

## 2021-09-17 ENCOUNTER — Ambulatory Visit: Payer: 59 | Admitting: Family Medicine

## 2021-10-13 ENCOUNTER — Other Ambulatory Visit: Payer: Self-pay

## 2021-10-29 ENCOUNTER — Other Ambulatory Visit: Payer: Self-pay | Admitting: Family Medicine

## 2021-10-29 ENCOUNTER — Other Ambulatory Visit: Payer: Self-pay

## 2021-10-29 MED ORDER — ATORVASTATIN CALCIUM 40 MG PO TABS
ORAL_TABLET | Freq: Every day | ORAL | 0 refills | Status: DC
  Filled 2021-10-29: qty 90, 90d supply, fill #0

## 2021-12-05 ENCOUNTER — Other Ambulatory Visit: Payer: Self-pay

## 2021-12-08 ENCOUNTER — Other Ambulatory Visit: Payer: Self-pay

## 2021-12-17 ENCOUNTER — Other Ambulatory Visit: Payer: Self-pay

## 2021-12-22 NOTE — Progress Notes (Unsigned)
Established patient visit   Patient: Lynn Coleman   DOB: 02/18/1971   51 y.o. Female  MRN: [id number] Visit Date: 12/24/2021  Today's healthcare provider: Myles Gip, DO   No chief complaint on file.  Subjective    Diabetes Mellitus Type II, Follow-up  Lab Results  Component Value Date   HGBA1C 7.2 (A) 09/10/2021   HGBA1C 6.1 (A) 06/04/2021   HGBA1C 7.5 (H) 12/18/2020   Wt Readings from Last 3 Encounters:  09/10/21 119 lb 9.6 oz (54.3 kg)  08/15/21 122 lb (55.3 kg)  06/04/21 122 lb (55.3 kg)   Last seen for diabetes on 09-10-21. Management since then includes A1c above goal, but still having rare hypoglycemic episodes Will use lifestyle management to improve Trial of CGM Continue current medications. She reports {excellent/good/fair/poor:19665} compliance with treatment. She {is/is not:21021397} having side effects. {document side effects if present:1} Symptoms: {Yes/No:20286} fatigue {Yes/No:20286} foot ulcerations  {Yes/No:20286} appetite changes {Yes/No:20286} nausea  {Yes/No:20286} paresthesia of the feet  {Yes/No:20286} polydipsia  {Yes/No:20286} polyuria {Yes/No:20286} visual disturbances   {Yes/No:20286} vomiting     Home blood sugar records: {diabetes glucometry results:16657}  Episodes of hypoglycemia? {Yes/No:20286} {enter symptoms and frequency of symptoms if yes:1}   Current insulin regiment: Levemir inject 11-12 units into skin daily  Most Recent Eye Exam: 07/16/21 {Current exercise:16438:::1} {Current diet habits:16563:::1}  Pertinent Labs: Lab Results  Component Value Date   CHOL 152 12/18/2020   HDL 58 12/18/2020   LDLCALC 86 12/18/2020   TRIG 34 12/18/2020   CHOLHDL 2.6 12/18/2020   Lab Results  Component Value Date   NA 144 12/18/2020   K 5.2 12/18/2020   CREATININE 0.61 12/18/2020   EGFR 108 12/18/2020   MICROALBUR 50 12/18/2020      ---------------------------------------------------------------------------------------------------   Medications: Outpatient Medications Prior to Visit  Medication Sig   atorvastatin (LIPITOR) 40 MG tablet TAKE 1 TABLET BY MOUTH DAILY.   Blood Glucose Monitoring Suppl (FREESTYLE LITE) DEVI Use to check blood glucose once daily   cetirizine (ZYRTEC) 10 MG tablet Take 10 mg by mouth daily.   clonazePAM (KLONOPIN) 0.5 MG tablet Take 1 tablet (0.5 mg total) by mouth 2 (two) times daily as needed.   Continuous Blood Gluc Receiver (FREESTYLE LIBRE READER) DEVI Use to monitor glucose continuously   Continuous Blood Gluc Sensor (FREESTYLE LIBRE 3 SENSOR) MISC 1 each by Does not apply route every 14 (fourteen) days. Place 1 sensor on the skin every 14 days. Use to check glucose continuously   COVID-19 At Home Antigen Test (CARESTART COVID-19 HOME TEST) KIT Use as directed   empagliflozin (JARDIANCE) 10 MG TABS tablet TAKE 1 TABLET (10 MG TOTAL) BY MOUTH DAILY BEFORE BREAKFAST.   estradiol (ESTRACE) 1 MG tablet Take 1 tablet (1 mg total) by mouth daily.   glucose blood (FREESTYLE LITE) test strip To check blood glucose once daily   insulin detemir (LEVEMIR) 100 UNIT/ML FlexPen INJECT 11-12 UNITS INTO THE SKIN DAILY.   Lancets (FREESTYLE) lancets Use as instructed   lisinopril (ZESTRIL) 10 MG tablet TAKE 1 TABLET BY MOUTH DAILY.   metFORMIN (GLUCOPHAGE) 1000 MG tablet TAKE 1 TABLET BY MOUTH 2 TIMES DAILY WITH A MEAL.   Multiple Vitamin (MULTIVITAMIN) tablet Take 1 tablet by mouth daily.   progesterone (PROMETRIUM) 200 MG capsule Take 1 capsule (200 mg total) by mouth daily. On calendar days 1-12 of each month   triamcinolone ointment (KENALOG) 0.5 % Apply 1 application topically 2 (two) times daily.  No facility-administered medications prior to visit.    Review of Systems  {Labs  Heme  Chem  Endocrine  Serology  Results Review (optional):23779}   Objective    LMP 12/02/2017  {Show  previous vital signs (optional):23777}  Physical Exam  ***  No results found for any visits on 12/24/21.  Assessment & Plan     ***  No follow-ups on file.      {provider attestation***:1}   Myles Gip, DO  [facility] [phone] (phone) [phone] (fax)  [place]

## 2021-12-24 ENCOUNTER — Other Ambulatory Visit: Payer: Self-pay | Admitting: Family Medicine

## 2021-12-24 ENCOUNTER — Ambulatory Visit (INDEPENDENT_AMBULATORY_CARE_PROVIDER_SITE_OTHER): Payer: 59 | Admitting: Family Medicine

## 2021-12-24 ENCOUNTER — Encounter: Payer: Self-pay | Admitting: Family Medicine

## 2021-12-24 ENCOUNTER — Other Ambulatory Visit: Payer: Self-pay

## 2021-12-24 ENCOUNTER — Ambulatory Visit: Payer: 59 | Admitting: Family Medicine

## 2021-12-24 VITALS — BP 122/82 | HR 96 | Temp 98.5°F | Resp 16 | Wt 118.6 lb

## 2021-12-24 DIAGNOSIS — Z794 Long term (current) use of insulin: Secondary | ICD-10-CM

## 2021-12-24 DIAGNOSIS — E11649 Type 2 diabetes mellitus with hypoglycemia without coma: Secondary | ICD-10-CM | POA: Diagnosis not present

## 2021-12-24 DIAGNOSIS — Z803 Family history of malignant neoplasm of breast: Secondary | ICD-10-CM

## 2021-12-24 DIAGNOSIS — E785 Hyperlipidemia, unspecified: Secondary | ICD-10-CM | POA: Diagnosis not present

## 2021-12-24 DIAGNOSIS — E1169 Type 2 diabetes mellitus with other specified complication: Secondary | ICD-10-CM | POA: Diagnosis not present

## 2021-12-24 DIAGNOSIS — Z23 Encounter for immunization: Secondary | ICD-10-CM

## 2021-12-24 DIAGNOSIS — Z79890 Hormone replacement therapy (postmenopausal): Secondary | ICD-10-CM | POA: Diagnosis not present

## 2021-12-24 DIAGNOSIS — Z0000 Encounter for general adult medical examination without abnormal findings: Secondary | ICD-10-CM | POA: Diagnosis not present

## 2021-12-24 DIAGNOSIS — Z1211 Encounter for screening for malignant neoplasm of colon: Secondary | ICD-10-CM | POA: Diagnosis not present

## 2021-12-24 LAB — POCT GLYCOSYLATED HEMOGLOBIN (HGB A1C)
Est. average glucose Bld gHb Est-mCnc: 160
Hemoglobin A1C: 7.2 % — AB (ref 4.0–5.6)

## 2021-12-24 MED ORDER — EMPAGLIFLOZIN 10 MG PO TABS
20.0000 mg | ORAL_TABLET | Freq: Every day | ORAL | 3 refills | Status: DC
  Filled 2021-12-24 – 2022-01-15 (×4): qty 180, 90d supply, fill #0

## 2021-12-24 NOTE — Assessment & Plan Note (Signed)
Still not to goal with diet changes, will increase Jardiance. Patient elects to increase to 20mg  instead of 25mg  as makes her urinate frequently. Foot exam completed today. Obtaining urine microalbumin today. UTD with PNA vaccine, eye exam.  F/u in 3 months for repeat A1c.

## 2021-12-24 NOTE — Patient Instructions (Signed)
  Diet Recommendations for Diabetes   1. Eat at least 3 meals and 1-2 snacks per day. Never go more than 4-5 hours while awake without eating. Eat breakfast within the first hour of getting up.   2. Limit starchy foods to TWO per meal and ONE per snack. ONE portion of a starchy  food is equal to the following:   - ONE slice of bread (or its equivalent, such as half of a hamburger bun).   - 1/2 cup of a "scoopable" starchy food such as potatoes or rice.   - 15 grams of Total Carbohydrate as shown on food label.  3. Include at every meal: a protein food, a carb food, and vegetables and/or fruit.   - Obtain twice the volume of vegetables as protein or carbohydrate foods for both lunch and dinner.   - Fresh or frozen vegetables are best.   - Keep frozen vegetables on hand for a quick vegetable serving.       Starchy (carb) foods: Bread, rice, pasta, potatoes, corn, cereal, grits, crackers, bagels, muffins, all baked goods.  (Fruits, milk, and yogurt also have carbohydrate, but most of these foods will not spike your blood sugar as most starchy foods will.)  A few fruits do cause high blood sugars; use small portions of bananas (limit to 1/2 at a time), grapes, watermelon, oranges, and most tropical fruits.    Protein foods: Meat, fish, poultry, eggs, dairy foods, and beans such as pinto and kidney beans (beans also provide carbohydrate).      Things to do to keep yourself healthy  - Exercise at least 30-45 minutes a day, 3-4 days a week.  - Eat a low-fat diet with lots of fruits and vegetables, up to 7-9 servings per day.  - Seatbelts can save your life. Wear them always.  - Smoke detectors on every level of your home, check batteries every year.  - Eye Doctor - have an eye exam every 1-2 years  - Safe sex - if you may be exposed to STDs, use a condom.  - Alcohol -  If you drink, do it moderately, less than 2 drinks per day.  - Health Care Power of Attorney. Choose someone to speak for you if  you are not able. https://www.prepareforyourcare.org is a great website to help you navigate this. - Depression is common in our stressful world.If you're feeling down or losing interest in things you normally enjoy, please come in for a visit.  - Violence - If anyone is threatening or hurting you, please call immediately.

## 2021-12-24 NOTE — Progress Notes (Signed)
BP 122/82 (BP Location: Right Arm, Patient Position: Sitting, Cuff Size: Normal)   Pulse 96   Temp 98.5 F (36.9 C) (Oral)   Resp 16   Wt 118 lb 9.6 oz (53.8 kg)   LMP 12/02/2017   SpO2 97%   BMI 18.58 kg/m    Subjective:    Patient ID: Lynn Coleman, female    DOB: Jul 17, 1970, 51 y.o.   MRN: [id number]  HPI: Lynn Coleman is a 51 y.o. female presenting on 12/24/2021 for comprehensive medical examination. Current medical complaints include:none  Diabetes, Type 2 - Last A1c 7.2 08/2021 - Medications: levemir 11-12u daily, metformin, jardiance - Compliance: good, on 8u of levemir daily - Checking BG at home: yes, 76% in 70-180. Not many lows. - Eye exam: UTD - Foot exam: due - Microalbumin: due, on ACEi/ARB - Statin: yes - PNA vaccine: UTD - Denies symptoms of hypoglycemia, polyuria, polydipsia, numbness extremities, foot ulcers/trauma  HRT - had symptoms of hair loss and hot flashes with previous decrease of estradiol. +FH of breast cancer. Went back to $Remov'1mg'IHRnEX$  at last visit 08/2021. Considering seeing GYN again. Much better with increase.    She currently lives with: husband, dog Menopausal Symptoms: yes  Depression Screen done today and results listed below:     12/24/2021    8:36 AM 08/15/2021    9:58 AM 06/04/2021    8:53 AM 12/18/2020    8:51 AM 12/13/2019    3:26 PM  Depression screen PHQ 2/9  Decreased Interest 0 0 0 0 0  Down, Depressed, Hopeless 0 0 0 0 0  PHQ - 2 Score 0 0 0 0 0  Altered sleeping 1  1 0 0  Tired, decreased energy 0  1 0 1  Change in appetite 0  0 0 1  Feeling bad or failure about yourself  0  0 0 0  Trouble concentrating 1  0 0 0  Moving slowly or fidgety/restless   0 0 0  Suicidal thoughts 0  0 0 0  PHQ-9 Score 2  2 0 2  Difficult doing work/chores Not difficult at all  Not difficult at all Not difficult at all Not difficult at all    The patient does not have a history of falls. I did not complete a risk assessment for falls. A  plan of care for falls was not documented.   Past Medical History:  Past Medical History:  Diagnosis Date   Allergic rhinitis    Anxiety    Diabetes mellitus without complication ([place])    History of chicken pox    Hyperlipidemia     Surgical History:  Past Surgical History:  Procedure Laterality Date   PLEURAL SCARIFICATION      Medications:  Current Outpatient Medications on File Prior to Visit  Medication Sig   atorvastatin (LIPITOR) 40 MG tablet TAKE 1 TABLET BY MOUTH DAILY.   Blood Glucose Monitoring Suppl (FREESTYLE LITE) DEVI Use to check blood glucose once daily   cetirizine (ZYRTEC) 10 MG tablet Take 10 mg by mouth daily.   clonazePAM (KLONOPIN) 0.5 MG tablet Take 1 tablet (0.5 mg total) by mouth 2 (two) times daily as needed.   Continuous Blood Gluc Receiver (FREESTYLE LIBRE READER) DEVI Use to monitor glucose continuously   Continuous Blood Gluc Sensor (FREESTYLE LIBRE 3 SENSOR) MISC 1 each by Does not apply route every 14 (fourteen) days. Place 1 sensor on the skin every 14 days. Use to check glucose continuously  COVID-19 At Home Antigen Test [facility] COVID-19 HOME TEST) KIT Use as directed   estradiol (ESTRACE) 1 MG tablet Take 1 tablet (1 mg total) by mouth daily.   glucose blood (FREESTYLE LITE) test strip To check blood glucose once daily   Lancets (FREESTYLE) lancets Use as instructed   lisinopril (ZESTRIL) 10 MG tablet TAKE 1 TABLET BY MOUTH DAILY.   metFORMIN (GLUCOPHAGE) 1000 MG tablet TAKE 1 TABLET BY MOUTH 2 TIMES DAILY WITH A MEAL.   Multiple Vitamin (MULTIVITAMIN) tablet Take 1 tablet by mouth daily.   progesterone (PROMETRIUM) 200 MG capsule Take 1 capsule (200 mg total) by mouth daily. On calendar days 1-12 of each month   triamcinolone ointment (KENALOG) 0.5 % Apply 1 application topically 2 (two) times daily.   insulin detemir (LEVEMIR) 100 UNIT/ML FlexPen INJECT 11-12 UNITS INTO THE SKIN DAILY.   [DISCONTINUED] Insulin Glargine (BASAGLAR KWIKPEN)  100 UNIT/ML Inject 11-12 Units into the skin daily.   No current facility-administered medications on file prior to visit.    Allergies:  No Known Allergies  Social History:  Social History   Socioeconomic History   Marital status: Married    Spouse name: Darrell   Number of children: 0   Years of education: Not on file   Highest education level: Doctorate  Occupational History    Employer: [employer]  Tobacco Use   Smoking status: Never   Smokeless tobacco: Never  Vaping Use   Vaping Use: Never used  Substance and Sexual Activity   Alcohol use: Yes    Comment: occasional. 1 glass of wine 2-3 times per month   Drug use: No   Sexual activity: Yes    Partners: Male    Birth control/protection: Patch  Other Topics Concern   Not on file  Social History Narrative   Not on file   Social Determinants of Health   Financial Resource Strain: Low Risk  (10/06/2017)   Overall Financial Resource Strain (CARDIA)    Difficulty of Paying Living Expenses: Not hard at all  Food Insecurity: No Food Insecurity (10/06/2017)   Hunger Vital Sign    Worried About Running Out of Food in the Last Year: Never true    Ran Out of Food in the Last Year: Never true  Transportation Needs: No Transportation Needs (10/06/2017)   PRAPARE - Hydrologist (Medical): No    Lack of Transportation (Non-Medical): No  Physical Activity: Insufficiently Active (10/06/2017)   Exercise Vital Sign    Days of Exercise per Week: 3 days    Minutes of Exercise per Session: 30 min  Stress: Not on file  Social Connections: Not on file  Intimate Partner Violence: Not on file   Social History   Tobacco Use  Smoking Status Never  Smokeless Tobacco Never   Social History   Substance and Sexual Activity  Alcohol Use Yes   Comment: occasional. 1 glass of wine 2-3 times per month    Family History:  Family History  Problem Relation Age of Onset   Arthritis Mother    Heart  disease Mother    Hypertension Mother    Hyperlipidemia Mother    Dementia Mother    Hypertension Father    Lung cancer Father        smoker   Hypertension Sister    Breast cancer Sister    Glaucoma Brother    Pancreatic cancer Brother    Asthma Maternal Grandmother    Stomach  cancer Maternal Grandfather    Gastric cancer Maternal Grandfather    Arthritis Paternal Grandmother    Colon cancer Neg Hx     Past medical history, surgical history, medications, allergies, family history and social history reviewed with patient today and changes made to appropriate areas of the chart.      Objective:    BP 122/82 (BP Location: Right Arm, Patient Position: Sitting, Cuff Size: Normal)   Pulse 96   Temp 98.5 F (36.9 C) (Oral)   Resp 16   Wt 118 lb 9.6 oz (53.8 kg)   LMP 12/02/2017   SpO2 97%   BMI 18.58 kg/m   Wt Readings from Last 3 Encounters:  12/24/21 118 lb 9.6 oz (53.8 kg)  09/10/21 119 lb 9.6 oz (54.3 kg)  08/15/21 122 lb (55.3 kg)    Physical Exam Constitutional:      Appearance: Normal appearance.  HENT:     Head: Normocephalic.     Right Ear: Tympanic membrane, ear canal and external ear normal.     Left Ear: Tympanic membrane, ear canal and external ear normal.     Nose: Nose normal.     Mouth/Throat:     Mouth: Mucous membranes are moist.     Pharynx: Oropharynx is clear.  Eyes:     Extraocular Movements: Extraocular movements intact.     Pupils: Pupils are equal, round, and reactive to light.  Cardiovascular:     Rate and Rhythm: Normal rate and regular rhythm.     Heart sounds: Normal heart sounds. No murmur heard. Pulmonary:     Effort: Pulmonary effort is normal.     Breath sounds: Normal breath sounds.  Abdominal:     General: Bowel sounds are normal.     Palpations: Abdomen is soft.     Tenderness: There is no abdominal tenderness.  Musculoskeletal:        General: Normal range of motion.     Right lower leg: No edema.     Left lower leg: No  edema.  Skin:    General: Skin is warm and dry.  Neurological:     Mental Status: She is alert and oriented to person, place, and time. Mental status is at baseline.     Gait: Gait normal.  Psychiatric:        Mood and Affect: Mood normal.        Behavior: Behavior normal.     Results for orders placed or performed in visit on 12/24/21  POCT HgB A1C  Result Value Ref Range   Hemoglobin A1C 7.2 (A) 4.0 - 5.6 %   Est. average glucose Bld gHb Est-mCnc 160       Assessment & Plan:   Problem List Items Addressed This Visit       Endocrine   Hyperlipidemia associated with type 2 diabetes mellitus ([place])   Relevant Medications   empagliflozin (JARDIANCE) 10 MG TABS tablet   Other Relevant Orders   Lipid panel   T2DM (type 2 diabetes mellitus) ([place]) - Primary    Still not to goal with diet changes, will increase Jardiance. Patient elects to increase to $RemoveBef'20mg'EfXdIdClXZ$  instead of $RemoveBe'25mg'wrdSkQcZv$  as makes her urinate frequently. Foot exam completed today. Obtaining urine microalbumin today. UTD with PNA vaccine, eye exam.  F/u in 3 months for repeat A1c.       Relevant Medications   empagliflozin (JARDIANCE) 10 MG TABS tablet   Other Relevant Orders   Urine Microalbumin w/creat. ratio  Comprehensive metabolic panel   Lipid panel   Hemoglobin A1c   POCT HgB A1C (Completed)     Other   Family history of breast cancer   Hormone replacement therapy (HRT)    Symptoms improved since dose increase. No changes today.      Other Visit Diagnoses     Annual physical exam       Relevant Orders   Urine Microalbumin w/creat. ratio   Comprehensive metabolic panel   CBC with Differential   Lipid panel   Hemoglobin A1c   Screening for colon cancer       Relevant Orders   Cologuard        Follow up plan: Return in about 3 months (around 03/26/2022) for diabetes.   LABORATORY TESTING:  - Pap smear: up to date  IMMUNIZATIONS:   - Tdap: Tetanus vaccination status reviewed: Td vaccination  indicated and given today. - Influenza: Postponed to flu season - Pneumovax: Up to date - HPV: Not applicable - Shingrix vaccine: had 1 dose of Shingrix.  - COVID vaccine: has received 4 doses of mRNA vaccine.  SCREENING: - Mammogram: Up to date  - Colonoscopy: Up to date  - Bone Density: Not applicable  - Lung Cancer Screening: Not applicable   Hep C Screening: UTD STD testing and prevention (HIV/chl/gon/syphilis): no concerns Sexual History : monogamous Menstrual History/LMP/Abnormal Bleeding:  Incontinence Symptoms: none reported  Osteoporosis: Discussed high calcium and vitamin D supplementation, weight bearing exercises  Advanced Care Planning: A voluntary discussion about advance care planning including the explanation and discussion of advance directives.  Discussed health care proxy and Living will, and the patient was able to identify a health care proxy as husband, Lynn Coleman.  Patient does not have a living will at present time. If patient does have living will, I have requested they bring this to the clinic to be scanned in to their chart.  PATIENT COUNSELING:   Advised to take 1 mg of folate supplement per day if capable of pregnancy.   Sexuality: Discussed sexually transmitted diseases, partner selection, use of condoms, avoidance of unintended pregnancy  and contraceptive alternatives.   Advised to avoid cigarette smoking.  I discussed with the patient that most people either abstain from alcohol or drink within safe limits (<=14/week and <=4 drinks/occasion for males, <=7/weeks and <= 3 drinks/occasion for females) and that the risk for alcohol disorders and other health effects rises proportionally with the number of drinks per week and how often a drinker exceeds daily limits.  Discussed cessation/primary prevention of drug use and availability of treatment for abuse.   Diet: Encouraged to adjust caloric intake to maintain  or achieve ideal body weight, to  reduce intake of dietary saturated fat and total fat, to limit sodium intake by avoiding high sodium foods and not adding table salt, and to maintain adequate dietary potassium and calcium preferably from fresh fruits, vegetables, and low-fat dairy products.    Stressed the importance of regular exercise.  Injury prevention: Discussed safety belts, safety helmets, smoke detector, smoking near bedding or upholstery.   Dental health: Discussed importance of regular tooth brushing, flossing, and dental visits.    NEXT PREVENTATIVE PHYSICAL DUE IN 1 YEAR. Return in about 3 months (around 03/26/2022) for diabetes.

## 2021-12-24 NOTE — Assessment & Plan Note (Signed)
Symptoms improved since dose increase. No changes today.

## 2021-12-25 ENCOUNTER — Other Ambulatory Visit: Payer: Self-pay

## 2021-12-25 LAB — CBC WITH DIFFERENTIAL/PLATELET
Basophils Absolute: 0 10*3/uL (ref 0.0–0.2)
Basos: 0 %
EOS (ABSOLUTE): 0.1 10*3/uL (ref 0.0–0.4)
Eos: 2 %
Hematocrit: 42.4 % (ref 34.0–46.6)
Hemoglobin: 13.5 g/dL (ref 11.1–15.9)
Immature Grans (Abs): 0 10*3/uL (ref 0.0–0.1)
Immature Granulocytes: 0 %
Lymphocytes Absolute: 2.7 10*3/uL (ref 0.7–3.1)
Lymphs: 39 %
MCH: 27.6 pg (ref 26.6–33.0)
MCHC: 31.8 g/dL (ref 31.5–35.7)
MCV: 87 fL (ref 79–97)
Monocytes Absolute: 0.5 10*3/uL (ref 0.1–0.9)
Monocytes: 7 %
Neutrophils Absolute: 3.5 10*3/uL (ref 1.4–7.0)
Neutrophils: 52 %
Platelets: 291 10*3/uL (ref 150–450)
RBC: 4.89 x10E6/uL (ref 3.77–5.28)
RDW: 12.9 % (ref 11.7–15.4)
WBC: 6.8 10*3/uL (ref 3.4–10.8)

## 2021-12-25 LAB — MICROALBUMIN / CREATININE URINE RATIO
Creatinine, Urine: 152.3 mg/dL
Microalb/Creat Ratio: 18 mg/g creat (ref 0–29)
Microalbumin, Urine: 28.1 ug/mL

## 2021-12-25 MED ORDER — ESTRADIOL 1 MG PO TABS
1.0000 mg | ORAL_TABLET | Freq: Every day | ORAL | 1 refills | Status: DC
  Filled 2021-12-25 – 2022-03-27 (×2): qty 90, 90d supply, fill #0
  Filled 2022-06-19: qty 90, 90d supply, fill #1

## 2021-12-31 ENCOUNTER — Other Ambulatory Visit: Payer: Self-pay | Admitting: *Deleted

## 2021-12-31 DIAGNOSIS — E1169 Type 2 diabetes mellitus with other specified complication: Secondary | ICD-10-CM

## 2021-12-31 DIAGNOSIS — E785 Hyperlipidemia, unspecified: Secondary | ICD-10-CM | POA: Diagnosis not present

## 2022-01-01 LAB — LIPID PANEL
Chol/HDL Ratio: 2.6 ratio (ref 0.0–4.4)
Cholesterol, Total: 166 mg/dL (ref 100–199)
HDL: 64 mg/dL (ref >39)
LDL Chol Calc (NIH): 90 mg/dL (ref 0–99)
Triglycerides: 62 mg/dL (ref 0–149)
VLDL Cholesterol Cal: 12 mg/dL (ref 5–40)

## 2022-01-01 NOTE — Progress Notes (Signed)
Cholesterol is stable. I recommend diet low in saturated fat and regular exercise - 30 min at least 5 times per week. Risk of heart attack/stroke is low at 4%.  The 10-year ASCVD risk score (Arnett DK, et al., 2019) is: 3.3%   Values used to calculate the score:     Age: 51 years     Sex: Female     Is Non-Hispanic African American: Yes     Diabetic: Yes     Tobacco smoker: No     Systolic Blood Pressure: 122 mmHg     Is BP treated: No     HDL Cholesterol: 64 mg/dL     Total Cholesterol: 166 mg/dL Please let us know if you have any questions.  Thank you, Jacky Kindle, FNP  [facility] [address] [place], [place] [address] [phone] (phone) [phone] (fax) [facility]

## 2022-01-07 ENCOUNTER — Other Ambulatory Visit: Payer: Self-pay

## 2022-01-13 ENCOUNTER — Other Ambulatory Visit: Payer: Self-pay

## 2022-01-14 ENCOUNTER — Other Ambulatory Visit: Payer: Self-pay

## 2022-01-14 ENCOUNTER — Other Ambulatory Visit: Payer: Self-pay | Admitting: Family Medicine

## 2022-01-14 ENCOUNTER — Telehealth: Payer: Self-pay

## 2022-01-14 DIAGNOSIS — Z794 Long term (current) use of insulin: Secondary | ICD-10-CM

## 2022-01-14 NOTE — Telephone Encounter (Signed)
Pt called and stated that she would like a refill for the following:  Requested Prescriptions   Pending Prescriptions Disp Refills   metFORMIN (GLUCOPHAGE) 1000 MG tablet 180 tablet 3    Sig: TAKE 1 TABLET BY MOUTH 2 TIMES DAILY WITH A MEAL.   empagliflozin (JARDIANCE) 10 MG TABS tablet 180 tablet 3    Sig: Take 2 tablets (20 mg total) by mouth daily before breakfast.   Please send to [facility].  Pt would like a new prescription for Jardiance. At last OV, 12/24/21 dosage was changed to 20 mg, pt called and stated that insurance will not cover 20 mg but will cover 25 mg. Pt would like increase medication to 25 mg, please advise pt.

## 2022-01-14 NOTE — Telephone Encounter (Signed)
Copied from CRM [id number]. Topic: General - Other >> Jan 14, 2022  4:51 PM Everette C wrote: Reason for CRM: The patient has been in contact with their pharmacy and been told that their metformin prescription has been denied and their jardiance prescription has been delayed   The patient would like to speak with a member of clinical staff when possible to discuss the denial and delay   Please contact further

## 2022-01-15 ENCOUNTER — Other Ambulatory Visit: Payer: Self-pay

## 2022-01-15 MED ORDER — EMPAGLIFLOZIN 25 MG PO TABS
25.0000 mg | ORAL_TABLET | Freq: Every day | ORAL | 3 refills | Status: DC
  Filled 2022-01-15: qty 30, 30d supply, fill #0
  Filled 2022-02-11 – 2022-02-19 (×2): qty 30, 30d supply, fill #1
  Filled 2022-03-20: qty 30, 30d supply, fill #2
  Filled 2022-04-15: qty 30, 30d supply, fill #3

## 2022-01-15 MED ORDER — METFORMIN HCL 1000 MG PO TABS
ORAL_TABLET | Freq: Two times a day (BID) | ORAL | 3 refills | Status: DC
  Filled 2022-01-15: qty 180, 90d supply, fill #0
  Filled 2022-04-15: qty 180, 90d supply, fill #1
  Filled 2022-07-22: qty 180, 90d supply, fill #2
  Filled 2022-10-20: qty 180, 90d supply, fill #3

## 2022-01-15 NOTE — Telephone Encounter (Signed)
Per triage note:  Pt would like a new prescription for Jardiance. At last OV, 12/24/21 dosage was changed to 20 mg, pt called and stated that insurance will not cover 20 mg but will cover 25 mg. Pt would like increase medication to 25 mg, please advise pt. Please advise.

## 2022-01-16 NOTE — Telephone Encounter (Signed)
Prescriptions sent yesterday to pharmacy.

## 2022-01-27 ENCOUNTER — Other Ambulatory Visit: Payer: Self-pay

## 2022-02-11 ENCOUNTER — Other Ambulatory Visit: Payer: Self-pay

## 2022-02-11 ENCOUNTER — Other Ambulatory Visit: Payer: Self-pay | Admitting: Family Medicine

## 2022-02-11 MED ORDER — LEVEMIR FLEXTOUCH 100 UNIT/ML ~~LOC~~ SOPN
PEN_INJECTOR | SUBCUTANEOUS | 1 refills | Status: DC
Start: 2022-02-11 — End: 2022-04-08
  Filled 2022-02-11: qty 3, 25d supply, fill #0
  Filled 2022-02-18: qty 9, 75d supply, fill #0

## 2022-02-11 MED ORDER — ATORVASTATIN CALCIUM 40 MG PO TABS
ORAL_TABLET | Freq: Every day | ORAL | 1 refills | Status: DC
  Filled 2022-02-11: qty 90, 90d supply, fill #0
  Filled 2022-02-11: qty 30, 30d supply, fill #0
  Filled 2022-02-18: qty 90, 90d supply, fill #0
  Filled 2022-05-27: qty 90, 90d supply, fill #1

## 2022-02-12 ENCOUNTER — Other Ambulatory Visit: Payer: Self-pay

## 2022-02-17 ENCOUNTER — Other Ambulatory Visit: Payer: Self-pay

## 2022-02-18 ENCOUNTER — Other Ambulatory Visit: Payer: Self-pay

## 2022-02-19 ENCOUNTER — Other Ambulatory Visit: Payer: Self-pay

## 2022-03-11 ENCOUNTER — Other Ambulatory Visit: Payer: Self-pay

## 2022-03-11 ENCOUNTER — Other Ambulatory Visit: Payer: Self-pay | Admitting: Family Medicine

## 2022-03-11 MED ORDER — CLONAZEPAM 0.5 MG PO TABS
0.5000 mg | ORAL_TABLET | Freq: Two times a day (BID) | ORAL | 0 refills | Status: DC | PRN
  Filled 2022-03-11: qty 30, 15d supply, fill #0

## 2022-03-20 ENCOUNTER — Other Ambulatory Visit: Payer: Self-pay

## 2022-03-27 ENCOUNTER — Other Ambulatory Visit: Payer: Self-pay

## 2022-04-01 ENCOUNTER — Ambulatory Visit: Payer: 59 | Admitting: Family Medicine

## 2022-04-04 DIAGNOSIS — Z1211 Encounter for screening for malignant neoplasm of colon: Secondary | ICD-10-CM | POA: Diagnosis not present

## 2022-04-08 ENCOUNTER — Encounter: Payer: Self-pay | Admitting: Family Medicine

## 2022-04-08 ENCOUNTER — Ambulatory Visit: Payer: 59 | Admitting: Family Medicine

## 2022-04-08 VITALS — BP 107/65 | HR 86 | Resp 16 | Wt 117.0 lb

## 2022-04-08 DIAGNOSIS — Z794 Long term (current) use of insulin: Secondary | ICD-10-CM | POA: Diagnosis not present

## 2022-04-08 DIAGNOSIS — E11649 Type 2 diabetes mellitus with hypoglycemia without coma: Secondary | ICD-10-CM | POA: Diagnosis not present

## 2022-04-08 LAB — POCT GLYCOSYLATED HEMOGLOBIN (HGB A1C)
Est. average glucose Bld gHb Est-mCnc: 143
Hemoglobin A1C: 6.6 % — AB (ref 4.0–5.6)

## 2022-04-08 MED ORDER — LEVEMIR FLEXTOUCH 100 UNIT/ML ~~LOC~~ SOPN: 6.0000 [IU] | PEN_INJECTOR | Freq: Every day | SUBCUTANEOUS | 1 refills | Status: DC

## 2022-04-08 NOTE — Assessment & Plan Note (Signed)
Chronic, at goal with intermittent dosing of increased jardiance, continue. No changes to other meds. UTD with screenings. F/u 4 months for recheck.

## 2022-04-08 NOTE — Addendum Note (Signed)
Addended by: Julieta Bellini on: 04/08/2022 09:04 AM   Modules accepted: Orders

## 2022-04-08 NOTE — Patient Instructions (Signed)
It was great to see you!  Our plans for today:  - No changes to your medications today. - Come back in 4 months.  We are checking some labs today, we will release these results to your MyChart.  Take care and seek immediate care sooner if you develop any concerns.   Dr. Ky Barban

## 2022-04-08 NOTE — Progress Notes (Signed)
   SUBJECTIVE:   CHIEF COMPLAINT / HPI:   Diabetes, Type 2 - Last A1c 7.2 12/2021 - Medications: levemir 6u daily, metformin, jardiance - previously on trulicity, had marked weight loss and hair loss.  - Compliance: sometimes takes 4x per week. Taking 6u levemir. - Checking BG at home: yes, CBG 88-207. Lowest in the morning. Highest 290s after dinner.  - Eye exam: UTD - Foot exam: UTD - Microalbumin: UTD - Statin: yes   OBJECTIVE:   BP 107/65 (BP Location: Left Arm, Patient Position: Sitting, Cuff Size: Normal)   Pulse 86   Resp 16   Wt 117 lb (53.1 kg)   LMP 12/02/2017   SpO2 100%   BMI 18.32 kg/m   Gen: well appearing, in NAD Card: Reg rate Lungs: Comfortable WOB on RA Ext: WWP, no edema   ASSESSMENT/PLAN:   T2DM (type 2 diabetes mellitus) ([facility]) Chronic, at goal with intermittent dosing of increased jardiance, continue. No changes to other meds. UTD with screenings. F/u 4 months for recheck.  Health Maintenance - completed Cologuard, awaiting results.     Myles Gip, DO

## 2022-04-09 LAB — BASIC METABOLIC PANEL
BUN/Creatinine Ratio: 29 — ABNORMAL HIGH (ref 9–23)
BUN: 17 mg/dL (ref 6–24)
CO2: 20 mmol/L (ref 20–29)
Calcium: 9.4 mg/dL (ref 8.7–10.2)
Chloride: 100 mmol/L (ref 96–106)
Creatinine, Ser: 0.58 mg/dL (ref 0.57–1.00)
Glucose: 113 mg/dL — ABNORMAL HIGH (ref 70–99)
Potassium: 4.4 mmol/L (ref 3.5–5.2)
Sodium: 141 mmol/L (ref 134–144)
eGFR: 109 mL/min/{1.73_m2} (ref >59)

## 2022-04-10 LAB — COLOGUARD: COLOGUARD: NEGATIVE

## 2022-04-12 ENCOUNTER — Encounter: Payer: Self-pay | Admitting: Family Medicine

## 2022-04-13 ENCOUNTER — Encounter: Payer: Self-pay | Admitting: Family Medicine

## 2022-04-15 ENCOUNTER — Other Ambulatory Visit: Payer: Self-pay | Admitting: Family Medicine

## 2022-04-15 ENCOUNTER — Other Ambulatory Visit: Payer: Self-pay

## 2022-04-15 MED ORDER — FREESTYLE LIBRE 3 SENSOR MISC
1.0000 | 5 refills | Status: DC
  Filled 2022-04-15: qty 2, 28d supply, fill #0
  Filled 2022-05-15: qty 2, 28d supply, fill #1
  Filled 2022-06-19: qty 2, 28d supply, fill #2
  Filled 2022-07-22: qty 2, 28d supply, fill #3
  Filled 2022-08-19: qty 2, 28d supply, fill #4
  Filled 2022-09-16: qty 2, 28d supply, fill #5

## 2022-04-22 ENCOUNTER — Telehealth: Payer: Self-pay

## 2022-04-22 NOTE — Telephone Encounter (Signed)
Copied from [place] [id number]. Topic: General - Inquiry >> Apr 22, 2022  4:27 PM Leilani Able wrote: Fu [phone] with pt. Pt states that she had seen a couple other providers during Dr Sharmaine Base time off but was wanting to get back on track with Wednesdays as is pt day off. No appts are available on Wed. She was requesting if Dr B could maybe possibly do MyChart chat with her on a Wed? Please fu to advise a work out [phone]

## 2022-04-23 NOTE — Telephone Encounter (Signed)
Can do Wed 11/15 at 10:40 or 11. Lynn Coleman may have to put on the one appt for that day to avoid opening all appts. Let me know if it is scheduled so I can mark it on my calendar

## 2022-04-23 NOTE — Telephone Encounter (Signed)
Is this possible?

## 2022-05-06 ENCOUNTER — Ambulatory Visit: Payer: 59 | Admitting: Family Medicine

## 2022-05-06 ENCOUNTER — Other Ambulatory Visit: Payer: Self-pay

## 2022-05-06 ENCOUNTER — Encounter: Payer: Self-pay | Admitting: Family Medicine

## 2022-05-06 VITALS — BP 138/78 | HR 74 | Resp 16 | Ht 66.0 in | Wt 117.0 lb

## 2022-05-06 DIAGNOSIS — Z794 Long term (current) use of insulin: Secondary | ICD-10-CM | POA: Diagnosis not present

## 2022-05-06 DIAGNOSIS — E11649 Type 2 diabetes mellitus with hypoglycemia without coma: Secondary | ICD-10-CM | POA: Diagnosis not present

## 2022-05-06 DIAGNOSIS — R634 Abnormal weight loss: Secondary | ICD-10-CM

## 2022-05-06 MED ORDER — EMPAGLIFLOZIN 10 MG PO TABS
10.0000 mg | ORAL_TABLET | Freq: Every day | ORAL | 1 refills | Status: DC
  Filled 2022-05-06: qty 90, 90d supply, fill #0

## 2022-05-06 NOTE — Assessment & Plan Note (Signed)
Continued slow weight loss Was in low 120s last year and now 117 May correlate with JArdiance dose increase Had trouble with weight loss from trulicity in the past UTD on cancer screenings No red flag symptoms One episode of possible melena, but then resolved and negative cologuard after that Check TSH And CBC Decrease jardiance to 10 mg daily Avoid meds with possible weight loss side effect F/u in 76m

## 2022-05-06 NOTE — Progress Notes (Signed)
Established Patient Office Visit  Subjective   Patient ID: Lynn Coleman, female    DOB: 1971-01-02  Age: 51 y.o. MRN: [id number]  Chief Complaint  Patient presents with   Diabetes   Weight Loss    Patient has concerns for recent unintentional weight loss. States she noticed it since her jardiance increased.     Diabetes   T2DM - Checking BG at home: yes, freestyle libre, few lows since intentionally eating more - Medications: Levemir 6u daily, Metformin 1079m BID, Jardiance 264mdaily (taking 3-4 times weekly to avoid weight loss) - Compliance: good, as above - Diet: low carb - eye exam: UTD - foot exam: UTD - microalbumin: UTD - denies symptoms of hypoglycemia, polyuria, polydipsia, numbness extremities, foot ulcers/trauma  Feels fine with no abd pain, fevers. Had one episode of black stool in August after eating a steak. Resolved after one episode.  Cologuard several weeks after that episode that was negative.  Continues to have weight loss  Moved and had a really stressful time in July and August. Was also very active at this time.  Jardiance was increased in July. Over the last month cutting jardiance in half.    ROS per HPI    Objective:     BP 138/78 (BP Location: Right Arm, Patient Position: Sitting, Cuff Size: Normal)   Pulse 74   Resp 16   Ht _0  (1.676 m)   Wt 117 lb (53.1 kg)   LMP 12/02/2017   SpO2 100%   BMI 18.88 kg/m    Physical Exam Vitals reviewed.  Constitutional:      General: She is not in acute distress.    Appearance: Normal appearance. She is well-developed. She is not diaphoretic.  HENT:     Head: Normocephalic and atraumatic.  Eyes:     General: No scleral icterus.    Conjunctiva/sclera: Conjunctivae normal.  Neck:     Thyroid: No thyromegaly.  Cardiovascular:     Rate and Rhythm: Normal rate and regular rhythm.     Pulses: Normal pulses.     Heart sounds: Normal heart sounds. No murmur heard. Pulmonary:     Effort:  Pulmonary effort is normal. No respiratory distress.     Breath sounds: Normal breath sounds. No wheezing, rhonchi or rales.  Musculoskeletal:     Cervical back: Neck supple.     Right lower leg: No edema.     Left lower leg: No edema.  Lymphadenopathy:     Cervical: No cervical adenopathy.  Skin:    General: Skin is warm and dry.     Findings: No rash.  Neurological:     Mental Status: She is alert and oriented to person, place, and time. Mental status is at baseline.  Psychiatric:        Mood and Affect: Mood normal.        Behavior: Behavior normal.      No results found for any visits on 05/06/22.  Last metabolic panel Lab Results  Component Value Date   GLUCOSE 113 (H) 04/08/2022   NA 141 04/08/2022   K 4.4 04/08/2022   CL 100 04/08/2022   CO2 20 04/08/2022   BUN 17 04/08/2022   CREATININE 0.58 04/08/2022   EGFR 109 04/08/2022   CALCIUM 9.4 04/08/2022   PROT 7.3 12/18/2020   ALBUMIN 4.2 12/18/2020   LABGLOB 3.1 12/18/2020   AGRATIO 1.4 12/18/2020   BILITOT 0.3 12/18/2020   ALKPHOS 59 12/18/2020   AST  18 12/18/2020   ALT 16 12/18/2020   Last lipids Lab Results  Component Value Date   CHOL 166 12/31/2021   HDL 64 12/31/2021   LDLCALC 90 12/31/2021   TRIG 62 12/31/2021   CHOLHDL 2.6 12/31/2021   Last hemoglobin A1c Lab Results  Component Value Date   HGBA1C 6.6 (A) 04/08/2022    The 10-year ASCVD risk score (Arnett DK, et al., 2019) is: 5.2%    Assessment & Plan:   Problem List Items Addressed This Visit       Endocrine   T2DM (type 2 diabetes mellitus) ([place]) - Primary    Well controlled with last A1c - reviewed Continue current medications, except decrease jardiance to 10 mg daily May need to increase basal insulin to compensate Continue to monitor home BG and BP UTD on vaccines, eye exam, foot exam On ACEi/ARB On Statin Discussed diet and exercise      Relevant Medications   empagliflozin (JARDIANCE) 10 MG TABS tablet     Other    Unintended weight loss    Continued slow weight loss Was in low 120s last year and now 117 May correlate with JArdiance dose increase Had trouble with weight loss from trulicity in the past UTD on cancer screenings No red flag symptoms One episode of possible melena, but then resolved and negative cologuard after that Check TSH And CBC Decrease jardiance to 10 mg daily Avoid meds with possible weight loss side effect F/u in 67m     Relevant Orders   TSH   CBC w/Diff/Platelet    Return in about 3 months (around 08/06/2022) for chronic disease f/u.    [place], ADionne Bucy MD, MPH [facility]

## 2022-05-06 NOTE — Assessment & Plan Note (Addendum)
Well controlled with last A1c - reviewed Continue current medications, except decrease jardiance to 10 mg daily May need to increase basal insulin to compensate Continue to monitor home BG and BP UTD on vaccines, eye exam, foot exam On ACEi/ARB On Statin Discussed diet and exercise

## 2022-05-06 NOTE — Progress Notes (Deleted)
    Established patient visit   Patient: Lynn Coleman   DOB: 08/15/1970   51 y.o. Female  MRN: [id number] Visit Date: 05/06/2022  Today's healthcare provider: Angela Bacigalupo, MD   I,Tiffany J Bragg,acting as a scribe for Angela Bacigalupo, MD.,have documented all relevant documentation on the behalf of Angela Bacigalupo, MD,as directed by  Angela Bacigalupo, MD while in the presence of Angela Bacigalupo, MD.   Chief Complaint  Patient presents with   Diabetes   Weight Loss    Patient has concerns for recent unintentional weight loss. States she noticed it since her jardiance increased.    Subjective    HPI HPI     Weight Loss    Additional comments: Patient has concerns for recent unintentional weight loss. States she noticed it since her jardiance increased.       Last edited by Bragg, Tiffany J, CMA on 05/06/2022 11:12 AM.      ***  Medications: Outpatient Medications Prior to Visit  Medication Sig   atorvastatin (LIPITOR) 40 MG tablet TAKE 1 TABLET BY MOUTH DAILY.   Blood Glucose Monitoring Suppl (FREESTYLE LITE) DEVI Use to check blood glucose once daily   cetirizine (ZYRTEC) 10 MG tablet Take 10 mg by mouth daily.   clonazePAM (KLONOPIN) 0.5 MG tablet Take 1 tablet (0.5 mg total) by mouth 2 (two) times daily as needed.   Continuous Blood Gluc Receiver (FREESTYLE LIBRE READER) DEVI Use to monitor glucose continuously   Continuous Blood Gluc Sensor (FREESTYLE LIBRE 3 SENSOR) MISC 1 each by Does not apply route every 14 (fourteen) days. Place 1 sensor on the skin every 14 days. Use to check glucose continuously   COVID-19 At Home Antigen Test (CARESTART COVID-19 HOME TEST) KIT Use as directed   empagliflozin (JARDIANCE) 25 MG TABS tablet Take 1 tablet (25 mg total) by mouth daily.   estradiol (ESTRACE) 1 MG tablet Take 1 tablet (1 mg total) by mouth daily.   glucose blood (FREESTYLE LITE) test strip To check blood glucose once daily   insulin detemir (LEVEMIR  FLEXTOUCH) 100 UNIT/ML FlexPen Inject 6 Units into the skin daily.   Lancets (FREESTYLE) lancets Use as instructed   lisinopril (ZESTRIL) 10 MG tablet TAKE 1 TABLET BY MOUTH DAILY.   metFORMIN (GLUCOPHAGE) 1000 MG tablet TAKE 1 TABLET BY MOUTH 2 TIMES DAILY WITH A MEAL.   Multiple Vitamin (MULTIVITAMIN) tablet Take 1 tablet by mouth daily.   progesterone (PROMETRIUM) 200 MG capsule Take 1 capsule (200 mg total) by mouth daily. On calendar days 1-12 of each month   triamcinolone ointment (KENALOG) 0.5 % Apply 1 application topically 2 (two) times daily.   No facility-administered medications prior to visit.    Review of Systems  {Labs  Heme  Chem  Endocrine  Serology  Results Review (optional):23779}   Objective    BP (!) 141/94 (BP Location: Right Arm, Patient Position: Sitting, Cuff Size: Normal)   Pulse 74   Resp 16   Ht 5' 6" (1.676 m)   Wt 117 lb (53.1 kg)   LMP 12/02/2017   SpO2 100%   BMI 18.88 kg/m  {Show previous vital signs (optional):23777}  Physical Exam  ***  No results found for any visits on 05/06/22.  Assessment & Plan     ***  Return in about 3 months (around 08/06/2022) for chronic disease f/u.      {provider attestation***:1}   Angela Bacigalupo, MD  [facility] [phone] (phone) [phone] (fax)    [facility]  

## 2022-05-07 LAB — CBC WITH DIFFERENTIAL/PLATELET
Basophils Absolute: 0 10*3/uL (ref 0.0–0.2)
Basos: 1 %
EOS (ABSOLUTE): 0.1 10*3/uL (ref 0.0–0.4)
Eos: 1 %
Hematocrit: 41.6 % (ref 34.0–46.6)
Hemoglobin: 13.6 g/dL (ref 11.1–15.9)
Immature Grans (Abs): 0 10*3/uL (ref 0.0–0.1)
Immature Granulocytes: 0 %
Lymphocytes Absolute: 2.4 10*3/uL (ref 0.7–3.1)
Lymphs: 41 %
MCH: 27.8 pg (ref 26.6–33.0)
MCHC: 32.7 g/dL (ref 31.5–35.7)
MCV: 85 fL (ref 79–97)
Monocytes Absolute: 0.4 10*3/uL (ref 0.1–0.9)
Monocytes: 8 %
Neutrophils Absolute: 2.9 10*3/uL (ref 1.4–7.0)
Neutrophils: 49 %
Platelets: 302 10*3/uL (ref 150–450)
RBC: 4.89 x10E6/uL (ref 3.77–5.28)
RDW: 12.5 % (ref 11.7–15.4)
WBC: 5.9 10*3/uL (ref 3.4–10.8)

## 2022-05-07 LAB — TSH: TSH: 1.81 u[IU]/mL (ref 0.450–4.500)

## 2022-05-15 ENCOUNTER — Other Ambulatory Visit: Payer: Self-pay

## 2022-05-27 ENCOUNTER — Other Ambulatory Visit: Payer: Self-pay

## 2022-06-19 ENCOUNTER — Other Ambulatory Visit: Payer: Self-pay

## 2022-06-24 ENCOUNTER — Telehealth: Payer: Self-pay | Admitting: Family Medicine

## 2022-06-24 ENCOUNTER — Other Ambulatory Visit: Payer: Self-pay | Admitting: Family Medicine

## 2022-06-24 ENCOUNTER — Other Ambulatory Visit: Payer: Self-pay

## 2022-06-24 DIAGNOSIS — Z794 Long term (current) use of insulin: Secondary | ICD-10-CM

## 2022-06-24 DIAGNOSIS — Z1231 Encounter for screening mammogram for malignant neoplasm of breast: Secondary | ICD-10-CM

## 2022-06-24 NOTE — Telephone Encounter (Signed)
Requested medication (s) are due for refill today: yes  Requested medication (s) are on the active medication list: yes  Last refill:  04/08/22 #65ml/1  Future visit scheduled: yes  Notes to clinic:    Pharmacy comment: Insurance prefers [place], can we get a script for that? Thanks!     Requested Prescriptions  Pending Prescriptions Disp Refills   insulin detemir (LEVEMIR FLEXTOUCH) 100 UNIT/ML FlexPen 45 mL 11    Sig: INJECT 11-12 UNITS INTO THE SKIN DAILY.     Endocrinology:  Diabetes - Insulins Passed - 06/24/2022  8:59 AM      Passed - HBA1C is between 0 and 7.9 and within 180 days    Hemoglobin A1C  Date Value Ref Range Status  04/08/2022 6.6 (A) 4.0 - 5.6 % Final   Hgb A1c MFr Bld  Date Value Ref Range Status  12/18/2020 7.5 (H) 4.8 - 5.6 % Final    Comment:             Prediabetes: 5.7 - 6.4          Diabetes: >6.4          Glycemic control for adults with diabetes: <7.0          Passed - Valid encounter within last 6 months    Recent Outpatient Visits           1 month ago Type 2 diabetes mellitus with hypoglycemia without coma, with long-term current use of insulin [facility])   [facility] [place], Dionne Bucy, MD   2 months ago Type 2 diabetes mellitus with hypoglycemia without coma, with long-term current use of insulin [facility])   [facility] [place] Rory Percy M, DO   6 months ago Type 2 diabetes mellitus with hypoglycemia without coma, with long-term current use of insulin [facility])   [facility] Rory Percy M, DO   9 months ago Type 2 diabetes mellitus with hypoglycemia without coma, with long-term current use of insulin [facility])   [facility] [place], Dionne Bucy, MD   10 months ago [place], Kirstie Peri, MD       Future Appointments             In 1 month Bacigalupo, Dionne Bucy, MD [facility]

## 2022-06-24 NOTE — Telephone Encounter (Signed)
Patient called in states, the med , is not being made anymore so she is asking for alternative, seemglee  to be approved.

## 2022-06-25 ENCOUNTER — Other Ambulatory Visit: Payer: Self-pay

## 2022-06-25 MED ORDER — INSULIN GLARGINE-YFGN 100 UNIT/ML ~~LOC~~ SOPN
6.0000 [IU] | PEN_INJECTOR | Freq: Every day | SUBCUTANEOUS | 5 refills | Status: DC
  Filled 2022-06-25: qty 15, 90d supply, fill #0

## 2022-06-25 NOTE — Telephone Encounter (Signed)
Semglee Rx sent to pharmacy in place of the [facility].  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 A1c ordered. She can have this done here or any [facility] at least 1-2 days before the virtual visit in Feb.

## 2022-06-25 NOTE — Telephone Encounter (Signed)
Patient reports [facility] will be discontinued and reports her insurance will cover Semglee. Patient also requesting an order for A1c to be checked before her virtual appt in February. Please advise.

## 2022-06-25 NOTE — Telephone Encounter (Signed)
Patient advised as below. She reports she is currently enrolled in the diabetes program.

## 2022-07-22 ENCOUNTER — Other Ambulatory Visit: Payer: Self-pay

## 2022-07-22 DIAGNOSIS — H524 Presbyopia: Secondary | ICD-10-CM | POA: Diagnosis not present

## 2022-07-22 DIAGNOSIS — H52223 Regular astigmatism, bilateral: Secondary | ICD-10-CM | POA: Diagnosis not present

## 2022-07-22 DIAGNOSIS — E119 Type 2 diabetes mellitus without complications: Secondary | ICD-10-CM | POA: Diagnosis not present

## 2022-07-22 DIAGNOSIS — H5213 Myopia, bilateral: Secondary | ICD-10-CM | POA: Diagnosis not present

## 2022-07-22 LAB — HM DIABETES EYE EXAM

## 2022-07-24 ENCOUNTER — Encounter: Payer: Self-pay | Admitting: Family Medicine

## 2022-07-29 ENCOUNTER — Ambulatory Visit
Admission: RE | Admit: 2022-07-29 | Discharge: 2022-07-29 | Disposition: A | Discharge status: Home / Self Care | Payer: Commercial Managed Care - PPO | Source: Ambulatory Visit | Attending: Family Medicine | Admitting: Family Medicine

## 2022-07-29 DIAGNOSIS — Z1231 Encounter for screening mammogram for malignant neoplasm of breast: Secondary | ICD-10-CM | POA: Insufficient documentation

## 2022-07-29 DIAGNOSIS — Z794 Long term (current) use of insulin: Secondary | ICD-10-CM | POA: Diagnosis not present

## 2022-07-29 DIAGNOSIS — E11649 Type 2 diabetes mellitus with hypoglycemia without coma: Secondary | ICD-10-CM | POA: Diagnosis not present

## 2022-07-30 LAB — HEMOGLOBIN A1C
Est. average glucose Bld gHb Est-mCnc: 166 mg/dL
Hgb A1c MFr Bld: 7.4 % — ABNORMAL HIGH (ref 4.8–5.6)

## 2022-08-03 NOTE — Progress Notes (Unsigned)
I,Bhavin Monjaraz S Laconya Clere,acting as a Education administrator for Lavon Paganini, MD.,have documented all relevant documentation on the behalf of Lavon Paganini, MD,as directed by  Lavon Paganini, MD while in the presence of Lavon Paganini, MD.   MyChart Video Visit    Virtual Visit via Video Note   This format is felt to be most appropriate for this patient at this time. Physical exam was limited by quality of the video and audio technology used for the visit.   Patient location: *** Provider location: ***  I discussed the limitations of evaluation and management by telemedicine and the availability of in person appointments. The patient expressed understanding and agreed to proceed.  Patient: Lynn Coleman   DOB: 1970-12-26   52 y.o. Female  MRN: [id number] Visit Date: 08/04/2022  Today's healthcare provider: Lavon Paganini, MD   No chief complaint on file.  Subjective    HPI  Diabetes Mellitus Type II, Follow-up  Lab Results  Component Value Date   HGBA1C 7.4 (H) 07/29/2022   HGBA1C 6.6 (A) 04/08/2022   HGBA1C 7.2 (A) 12/24/2021   Wt Readings from Last 3 Encounters:  05/06/22 117 lb (53.1 kg)  04/08/22 117 lb (53.1 kg)  12/24/21 118 lb 9.6 oz (53.8 kg)   Last seen for diabetes 3 months ago.  Management since then includes decrease jardiance to 10 gm daily. May need to incease basal insulin to compensate. She reports {excellent/good/fair/poor:19665} compliance with treatment. She {is/is not:21021397} having side effects. {document side effects if present:1}   Home blood sugar records: {diabetes glucometry results:16657}  Episodes of hypoglycemia? {Yes/No:20286} {enter symptoms and frequency of symptoms if yes:1}   Current insulin regiment: 6 units daily Most Recent Eye Exam: UTD  Pertinent Labs: Lab Results  Component Value Date   CHOL 166 12/31/2021   HDL 64 12/31/2021   LDLCALC 90 12/31/2021   TRIG 62 12/31/2021   CHOLHDL 2.6 12/31/2021   Lab Results   Component Value Date   NA 141 04/08/2022   K 4.4 04/08/2022   CREATININE 0.58 04/08/2022   EGFR 109 04/08/2022   LABMICR 28.1 12/24/2021   MICRALBCREAT 18 12/24/2021     ---------------------------------------------------------------------------------------------------     Medications: Outpatient Medications Prior to Visit  Medication Sig   atorvastatin (LIPITOR) 40 MG tablet TAKE 1 TABLET BY MOUTH DAILY.   Blood Glucose Monitoring Suppl (FREESTYLE LITE) DEVI Use to check blood glucose once daily   cetirizine (ZYRTEC) 10 MG tablet Take 10 mg by mouth daily.   clonazePAM (KLONOPIN) 0.5 MG tablet Take 1 tablet (0.5 mg total) by mouth 2 (two) times daily as needed.   Continuous Blood Gluc Receiver (FREESTYLE LIBRE READER) DEVI Use to monitor glucose continuously   Continuous Blood Gluc Sensor (FREESTYLE LIBRE 3 SENSOR) MISC 1 each by Does not apply route every 14 (fourteen) days. Place 1 sensor on the skin every 14 days. Use to check glucose continuously   COVID-19 At Home Antigen Test (CARESTART COVID-19 HOME TEST) KIT Use as directed   empagliflozin (JARDIANCE) 10 MG TABS tablet Take 1 tablet (10 mg total) by mouth daily.   estradiol (ESTRACE) 1 MG tablet Take 1 tablet (1 mg total) by mouth daily.   glucose blood (FREESTYLE LITE) test strip To check blood glucose once daily   insulin glargine-yfgn (SEMGLEE) 100 UNIT/ML Pen Inject 6 Units into the skin daily.   Lancets (FREESTYLE) lancets Use as instructed   lisinopril (ZESTRIL) 10 MG tablet TAKE 1 TABLET BY MOUTH DAILY.   metFORMIN (  GLUCOPHAGE) 1000 MG tablet TAKE 1 TABLET BY MOUTH 2 TIMES DAILY WITH A MEAL.   Multiple Vitamin (MULTIVITAMIN) tablet Take 1 tablet by mouth daily.   progesterone (PROMETRIUM) 200 MG capsule Take 1 capsule (200 mg total) by mouth daily. On calendar days 1-12 of each month   triamcinolone ointment (KENALOG) 0.5 % Apply 1 application topically 2 (two) times daily.   No facility-administered medications  prior to visit.    Review of Systems  {Labs  Heme  Chem  Endocrine  Serology  Results Review (optional):23779}   Objective    LMP 12/02/2017   {Show previous vital signs (optional):23777}   Physical Exam     Assessment & Plan     ***  No follow-ups on file.     I discussed the assessment and treatment plan with the patient. The patient was provided an opportunity to ask questions and all were answered. The patient agreed with the plan and demonstrated an understanding of the instructions.   The patient was advised to call back or seek an in-person evaluation if the symptoms worsen or if the condition fails to improve as anticipated.  I provided *** minutes of non-face-to-face time during this encounter.  {provider attestation***:1}  Lavon Paganini, MD [facility] [phone] (phone) [phone] (fax)  [place]

## 2022-08-04 ENCOUNTER — Ambulatory Visit: Payer: Commercial Managed Care - PPO | Admitting: Family Medicine

## 2022-08-04 ENCOUNTER — Encounter: Payer: Self-pay | Admitting: Family Medicine

## 2022-08-04 ENCOUNTER — Other Ambulatory Visit: Payer: Self-pay

## 2022-08-04 ENCOUNTER — Telehealth: Payer: 59 | Admitting: Family Medicine

## 2022-08-04 VITALS — BP 104/66 | Wt 119.6 lb

## 2022-08-04 DIAGNOSIS — E1169 Type 2 diabetes mellitus with other specified complication: Secondary | ICD-10-CM

## 2022-08-04 DIAGNOSIS — E785 Hyperlipidemia, unspecified: Secondary | ICD-10-CM | POA: Diagnosis not present

## 2022-08-04 DIAGNOSIS — Z7984 Long term (current) use of oral hypoglycemic drugs: Secondary | ICD-10-CM | POA: Diagnosis not present

## 2022-08-04 DIAGNOSIS — E11649 Type 2 diabetes mellitus with hypoglycemia without coma: Secondary | ICD-10-CM | POA: Diagnosis not present

## 2022-08-04 DIAGNOSIS — Z794 Long term (current) use of insulin: Secondary | ICD-10-CM

## 2022-08-04 DIAGNOSIS — R634 Abnormal weight loss: Secondary | ICD-10-CM | POA: Diagnosis not present

## 2022-08-04 MED ORDER — INSULIN GLARGINE-YFGN 100 UNIT/ML ~~LOC~~ SOPN
10.0000 [IU] | PEN_INJECTOR | Freq: Every day | SUBCUTANEOUS | 5 refills | Status: DC
  Filled 2022-08-04: qty 15, 150d supply, fill #0
  Filled 2022-12-16: qty 9, 90d supply, fill #0

## 2022-08-04 MED ORDER — EMPAGLIFLOZIN 25 MG PO TABS
25.0000 mg | ORAL_TABLET | Freq: Every day | ORAL | 1 refills | Status: DC
  Filled 2022-08-04: qty 90, 90d supply, fill #0
  Filled 2022-11-04 (×2): qty 90, 90d supply, fill #1

## 2022-08-04 NOTE — Assessment & Plan Note (Signed)
Last A1c elevated at 7.4 Will increase jardiance back to 45m daily Continue 8 u of basal insulin daily, but consider increasing to 10 u if needed for glucose control UTD on eye exam, foot exam, UACR On ACEi/ARB On Statin Discussed diet and exercise F/u in 6 months

## 2022-08-04 NOTE — Assessment & Plan Note (Signed)
Previously well controlled Continue statin Repeat FLP and CMP at CPE in 7/24

## 2022-08-04 NOTE — Assessment & Plan Note (Signed)
Weight has stabilized Patient believes it is more related to stress than the jardiance Ok to increase jardiance back to 25 mg daily as above

## 2022-08-05 ENCOUNTER — Encounter: Payer: Self-pay | Admitting: Family Medicine

## 2022-08-05 ENCOUNTER — Ambulatory Visit: Payer: Commercial Managed Care - PPO | Admitting: Family Medicine

## 2022-08-05 ENCOUNTER — Other Ambulatory Visit: Payer: Self-pay

## 2022-08-05 VITALS — BP 111/68 | HR 76 | Ht 67.0 in | Wt 119.0 lb

## 2022-08-05 DIAGNOSIS — Z803 Family history of malignant neoplasm of breast: Secondary | ICD-10-CM | POA: Diagnosis not present

## 2022-08-05 DIAGNOSIS — R232 Flushing: Secondary | ICD-10-CM | POA: Diagnosis not present

## 2022-08-05 MED ORDER — ESTRADIOL 0.025 MG/24HR TD PTTW
1.0000 | MEDICATED_PATCH | TRANSDERMAL | 12 refills | Status: DC
  Filled 2022-08-05: qty 8, 28d supply, fill #0
  Filled 2022-09-02: qty 8, 28d supply, fill #1

## 2022-08-05 MED ORDER — PROGESTERONE MICRONIZED 100 MG PO CAPS
100.0000 mg | ORAL_CAPSULE | Freq: Every day | ORAL | 3 refills | Status: DC
  Filled 2022-08-05: qty 12, 30d supply, fill #0
  Filled 2022-09-16: qty 3, 3d supply, fill #1
  Filled 2022-09-16: qty 9, 9d supply, fill #1
  Filled 2022-10-20: qty 12, 12d supply, fill #2
  Filled 2022-11-17: qty 12, 12d supply, fill #3
  Filled 2022-12-16: qty 12, 12d supply, fill #4
  Filled 2023-01-20: qty 12, 12d supply, fill #5
  Filled 2023-02-10: qty 12, 12d supply, fill #6
  Filled 2023-03-16: qty 12, 12d supply, fill #7

## 2022-08-05 NOTE — Assessment & Plan Note (Signed)
Referral to oncology for discussion about possible testing.

## 2022-08-05 NOTE — Progress Notes (Signed)
Patient here to establish care.  [id number] Menopausal on progesterone and Estrogen  Last pap:12/13/19 WNL  Mammogram:07/31/22 WNL  Family Hx of Breast Cancer :Sister dx at 53 about 2 yrs ago. Flu Vaccine : Already Received   Pt has been on HRT x 5 years wants to discuss if she needs to continue concerned since her had Breat cancer.   Fun Fact: Patient loves to read books.

## 2022-08-05 NOTE — Assessment & Plan Note (Addendum)
Discussed risks of continued HRT, usual recommendation to stop at 5 years, progesterone as thought to be the most likely culprit in increased risk of Breast cancer. She has previously failed considerations diminishing dose. We discussed transdermal application of estrogen is likely best. Discussed that 1/3 of women have persistent sx's requiring lifelong treatment. We discussed Veozah and agreed to do more research into this as possible option, though I admittedly knew very little about this at the time of her visit. It is notable for being a Neurokinin 3 inhibitor and works at the brain level to interrupt hot flashes. Does not appear to be hormone mediated at all. Efficacy was about 64 percent. It is taken once daily and is contraindicated in anyone with liver or kidney disease. It is an alternative for patients who cannot take HRT. It requires LFT monitoring q 3 months for the first 9 months of use. Common side effects are abdominal pain, diarrhea, insomnia, back pain, hot flush and elevated hepatic transaminases. She will consider and let me know if she desires to proceed. In the interim, will decrease her prometrium to 100 mg (unlikely to contribute to hot flashes) and take it cyclically, and will change to vivelle and see how she does with the adhesive and if she has a return of hot flashes, could increase it above 0.025. Transdermal is the best delivery for estrogen and avoids 1st pass liver effects.  Discussed lifestyle issues including avoiding hot beverages, dressing in layers and turning down the thermostat by 1 degree.

## 2022-08-05 NOTE — Progress Notes (Signed)
Subjective:    Patient ID: Lynn Coleman is a 52 y.o. female presenting with Establish Care  on 08/05/2022  HPI: On HRT x 5 years. No bleeding. Initially started for severe hot flashes and night sweats which were quite disruptive to her life. Initially, placed on [place] 0.025 which worked well, until a generic was given and she developed an allergic reaction to the adhesive and switched to 1 mg estradiol tabs. She is on cyclic progesterone. Her PCP tried to decrease her dose by 1/2 about 1 year ago of the estradiol and the Prometrium and her hot flashes returned with a vengeance.  She has a sister diagnosed with breast cancer in the last 18 months. She was around age 27. Has brother with pancreatic cancer and grandparent with stomach cancer. No formal genetic carrier testing. She is concerned about continued HRT use and risk of cancer.   Review of Systems  Constitutional:  Negative for chills and fever.  Respiratory:  Negative for shortness of breath.   Cardiovascular:  Negative for chest pain.  Gastrointestinal:  Negative for abdominal pain, nausea and vomiting.  Genitourinary:  Negative for dysuria.  Skin:  Negative for rash.      Objective:    BP 111/68   Pulse 76   Ht 5' 7"$  (1.702 m)   Wt 119 lb (54 kg)   LMP 12/02/2017   BMI 18.64 kg/m  Physical Exam Exam conducted with a chaperone present.  Constitutional:      General: She is not in acute distress.    Appearance: She is well-developed.  HENT:     Head: Normocephalic and atraumatic.  Eyes:     General: No scleral icterus. Cardiovascular:     Rate and Rhythm: Normal rate.  Pulmonary:     Effort: Pulmonary effort is normal.  Abdominal:     Palpations: Abdomen is soft.  Musculoskeletal:     Cervical back: Neck supple.  Skin:    General: Skin is warm and dry.  Neurological:     Mental Status: She is alert and oriented to person, place, and time.         Assessment & Plan:   Problem List Items Addressed  This Visit       Unprioritized   Hot flashes - Primary    Discussed risks of continued HRT, usual recommendation to stop at 5 years, progesterone as thought to be the most likely culprit in increased risk of Breast cancer. She has previously failed considerations diminishing dose. We discussed transdermal application of estrogen is likely best. Discussed that 1/3 of women have persistent sx's requiring lifelong treatment. We discussed Veozah and agreed to do more research into this as possible option, though I admittedly knew very little about this at the time of her visit. It is notable for being a Neurokinin 3 inhibitor and works at the brain level to interrupt hot flashes. Does not appear to be hormone mediated at all. Efficacy was about 64 percent. It is taken once daily and is contraindicated in anyone with liver or kidney disease. It is an alternative for patients who cannot take HRT. It requires LFT monitoring q 3 months for the first 9 months of use. Common side effects are abdominal pain, diarrhea, insomnia, back pain, hot flush and elevated hepatic transaminases. She will consider and let me know if she desires to proceed. In the interim, will decrease her prometrium to 100 mg (unlikely to contribute to hot flashes) and take it cyclically,  and will change to vivelle and see how she does with the adhesive and if she has a return of hot flashes, could increase it above 0.025. Transdermal is the best delivery for estrogen and avoids 1st pass liver effects.  Discussed lifestyle issues including avoiding hot beverages, dressing in layers and turning down the thermostat by 1 degree.      Relevant Medications   estradiol (VIVELLE-DOT) 0.025 MG/24HR (Start on 08/06/2022)   progesterone (PROMETRIUM) 100 MG capsule   Family history of breast cancer    Referral to oncology for discussion about possible testing.      Relevant Orders   Ambulatory referral to Genetics   Return in about 6 months  (around 02/03/2023) for a CPE.  Donnamae Jude, MD 08/05/2022 2:31 PM

## 2022-08-06 ENCOUNTER — Other Ambulatory Visit (HOSPITAL_COMMUNITY): Payer: Self-pay

## 2022-08-06 ENCOUNTER — Other Ambulatory Visit: Payer: Self-pay

## 2022-08-19 ENCOUNTER — Other Ambulatory Visit: Payer: Self-pay | Admitting: Family Medicine

## 2022-08-19 ENCOUNTER — Other Ambulatory Visit: Payer: Self-pay

## 2022-08-20 ENCOUNTER — Other Ambulatory Visit: Payer: Self-pay

## 2022-08-20 MED ORDER — ATORVASTATIN CALCIUM 40 MG PO TABS
40.0000 mg | ORAL_TABLET | Freq: Every day | ORAL | 0 refills | Status: DC
  Filled 2022-08-20: qty 90, 90d supply, fill #0

## 2022-08-20 NOTE — Telephone Encounter (Signed)
Requested Prescriptions  Pending Prescriptions Disp Refills   atorvastatin (LIPITOR) 40 MG tablet 90 tablet 0    Sig: TAKE 1 TABLET BY MOUTH DAILY.     Cardiovascular:  Antilipid - Statins Failed - 08/19/2022  1:43 PM      Failed - Lipid Panel in normal range within the last 12 months    Cholesterol, Total  Date Value Ref Range Status  12/31/2021 166 100 - 199 mg/dL Final   LDL Chol Calc (NIH)  Date Value Ref Range Status  12/31/2021 90 0 - 99 mg/dL Final   HDL  Date Value Ref Range Status  12/31/2021 64 >39 mg/dL Final   Triglycerides  Date Value Ref Range Status  12/31/2021 62 0 - 149 mg/dL Final         Passed - Patient is not pregnant      Passed - Valid encounter within last 12 months    Recent Outpatient Visits           2 weeks ago Type 2 diabetes mellitus with hypoglycemia without coma, with long-term current use of insulin ([place])   [place], Dionne Bucy, MD   3 months ago Type 2 diabetes mellitus with hypoglycemia without coma, with long-term current use of insulin [facility])   [place], Dionne Bucy, MD   4 months ago Type 2 diabetes mellitus with hypoglycemia without coma, with long-term current use of insulin [facility])   [place] Rory Percy M, DO   7 months ago Type 2 diabetes mellitus with hypoglycemia without coma, with long-term current use of insulin [facility])   [place] Rory Percy M, DO   11 months ago Type 2 diabetes mellitus with hypoglycemia without coma, with long-term current use of insulin [facility])   [place], Dionne Bucy, MD       Future Appointments             In 2 months Bacigalupo, Dionne Bucy, MD [facility]

## 2022-09-02 ENCOUNTER — Inpatient Hospital Stay: Payer: Commercial Managed Care - PPO | Admitting: Licensed Clinical Social Worker

## 2022-09-02 ENCOUNTER — Other Ambulatory Visit: Payer: Self-pay

## 2022-09-02 ENCOUNTER — Inpatient Hospital Stay: Payer: Commercial Managed Care - PPO

## 2022-09-03 ENCOUNTER — Other Ambulatory Visit: Payer: Self-pay

## 2022-09-13 ENCOUNTER — Encounter: Payer: Self-pay | Admitting: Family Medicine

## 2022-09-13 DIAGNOSIS — Z79890 Hormone replacement therapy (postmenopausal): Secondary | ICD-10-CM

## 2022-09-16 ENCOUNTER — Other Ambulatory Visit: Payer: Self-pay

## 2022-09-16 MED ORDER — ESTRADIOL 1 MG PO TABS
1.0000 mg | ORAL_TABLET | Freq: Every day | ORAL | 1 refills | Status: DC
  Filled 2022-09-16: qty 90, 90d supply, fill #0
  Filled 2023-01-27 (×2): qty 90, 90d supply, fill #1

## 2022-09-30 ENCOUNTER — Other Ambulatory Visit: Payer: Self-pay | Admitting: Physician Assistant

## 2022-10-01 ENCOUNTER — Other Ambulatory Visit: Payer: Self-pay

## 2022-10-01 MED ORDER — CLONAZEPAM 0.5 MG PO TABS
0.5000 mg | ORAL_TABLET | Freq: Two times a day (BID) | ORAL | 0 refills | Status: DC | PRN
  Filled 2022-10-01: qty 30, 15d supply, fill #0

## 2022-10-02 ENCOUNTER — Other Ambulatory Visit: Payer: Self-pay

## 2022-10-12 ENCOUNTER — Other Ambulatory Visit: Payer: Self-pay

## 2022-10-20 ENCOUNTER — Other Ambulatory Visit: Payer: Self-pay | Admitting: Family Medicine

## 2022-10-20 ENCOUNTER — Other Ambulatory Visit: Payer: Self-pay

## 2022-10-20 MED ORDER — FREESTYLE LIBRE 3 SENSOR MISC
1.0000 | 5 refills | Status: DC
  Filled 2022-10-20: qty 2, 28d supply, fill #0
  Filled 2022-11-17: qty 2, 28d supply, fill #1
  Filled 2022-12-16: qty 2, 28d supply, fill #2
  Filled 2023-01-20: qty 2, 28d supply, fill #3
  Filled 2023-02-10: qty 2, 28d supply, fill #4
  Filled 2023-03-16: qty 2, 28d supply, fill #5

## 2022-11-04 ENCOUNTER — Other Ambulatory Visit: Payer: Self-pay

## 2022-11-04 ENCOUNTER — Other Ambulatory Visit (HOSPITAL_COMMUNITY): Payer: Self-pay

## 2022-11-12 ENCOUNTER — Other Ambulatory Visit (HOSPITAL_COMMUNITY): Payer: Self-pay

## 2022-11-13 ENCOUNTER — Telehealth: Payer: Self-pay | Admitting: Family Medicine

## 2022-11-17 ENCOUNTER — Other Ambulatory Visit: Payer: Self-pay | Admitting: Family Medicine

## 2022-11-17 ENCOUNTER — Encounter: Payer: Commercial Managed Care - PPO | Admitting: Family Medicine

## 2022-11-17 ENCOUNTER — Other Ambulatory Visit: Payer: Self-pay

## 2022-11-17 MED ORDER — LISINOPRIL 10 MG PO TABS
10.0000 mg | ORAL_TABLET | Freq: Every day | ORAL | 0 refills | Status: DC
  Filled 2022-11-17: qty 90, 90d supply, fill #0

## 2022-11-17 MED ORDER — ATORVASTATIN CALCIUM 40 MG PO TABS
40.0000 mg | ORAL_TABLET | Freq: Every day | ORAL | 0 refills | Status: DC
  Filled 2022-11-17: qty 90, 90d supply, fill #0

## 2022-11-18 ENCOUNTER — Ambulatory Visit (INDEPENDENT_AMBULATORY_CARE_PROVIDER_SITE_OTHER): Payer: Commercial Managed Care - PPO | Admitting: Physician Assistant

## 2022-11-18 ENCOUNTER — Encounter: Payer: Self-pay | Admitting: Physician Assistant

## 2022-11-18 ENCOUNTER — Encounter: Payer: Self-pay | Admitting: Family Medicine

## 2022-11-18 VITALS — BP 107/63 | HR 85 | Temp 98.4°F | Resp 14 | Ht 67.0 in | Wt 120.6 lb

## 2022-11-18 DIAGNOSIS — Z794 Long term (current) use of insulin: Secondary | ICD-10-CM

## 2022-11-18 DIAGNOSIS — E1169 Type 2 diabetes mellitus with other specified complication: Secondary | ICD-10-CM | POA: Diagnosis not present

## 2022-11-18 DIAGNOSIS — J302 Other seasonal allergic rhinitis: Secondary | ICD-10-CM | POA: Diagnosis not present

## 2022-11-18 DIAGNOSIS — Z0000 Encounter for general adult medical examination without abnormal findings: Secondary | ICD-10-CM

## 2022-11-18 DIAGNOSIS — E785 Hyperlipidemia, unspecified: Secondary | ICD-10-CM

## 2022-11-18 DIAGNOSIS — E11649 Type 2 diabetes mellitus with hypoglycemia without coma: Secondary | ICD-10-CM | POA: Diagnosis not present

## 2022-11-18 LAB — POCT GLYCOSYLATED HEMOGLOBIN (HGB A1C)
Est. average glucose Bld gHb Est-mCnc: 143
Hemoglobin A1C: 6.6 % — AB (ref 4.0–5.6)

## 2022-11-18 NOTE — Progress Notes (Signed)
Complete physical exam   I,Roshena L Chambers,acting as a scribe for [employer], PA-C.,have documented all relevant documentation on the behalf of Debera Lat, PA-C,as directed by  [employer], PA-C while in the presence of [employer], PA-C.   Patient: Lynn Coleman   DOB: 06-23-1970   52 y.o. Female  MRN: [id number] Visit Date: 11/18/2022  Today's healthcare provider: Debera Lat, PA-C   Chief Complaint  Patient presents with   Annual Exam   Subjective    ZARAY ARRUE is a 52 y.o. female who presents today for a complete physical exam.  She reports consuming a general diet. Home exercise routine includes walking and cardio. She generally feels fairly well. She reports sleeping fairly well. She does not have additional problems to discuss today.  HPI   Last depression screening scores    11/18/2022   11:08 AM 05/06/2022   11:12 AM 04/08/2022    8:48 AM  PHQ 2/9 Scores  PHQ - 2 Score 0 0 0  PHQ- 9 Score  4 0   Last fall risk screening    11/18/2022   11:08 AM  Fall Risk   Falls in the past year? 0  Number falls in past yr: 0  Injury with Fall? 0  Risk for fall due to : No Fall Risks  Follow up Falls evaluation completed   Last Audit-C alcohol use screening    05/06/2022   11:13 AM  Alcohol Use Disorder Test (AUDIT)  1. How often do you have a drink containing alcohol? 2  2. How many drinks containing alcohol do you have on a typical day when you are drinking? 0  3. How often do you have six or more drinks on one occasion? 0  AUDIT-C Score 2   A score of 3 or more in women, and 4 or more in men indicates increased risk for alcohol abuse, EXCEPT if all of the points are from question 1   Past Medical History:  Diagnosis Date   Allergic rhinitis    Anxiety    Diabetes mellitus without complication (HCC)    History of chicken pox    Hyperlipidemia    Past Surgical History:  Procedure Laterality Date   PLEURAL SCARIFICATION      Social History   Socioeconomic History   Marital status: Married    Spouse name: Darrell   Number of children: 0   Years of education: Not on file   Highest education level: Doctorate  Occupational History    Employer: [employer]  Tobacco Use   Smoking status: Never   Smokeless tobacco: Never  Vaping Use   Vaping Use: Never used  Substance and Sexual Activity   Alcohol use: Yes    Comment: occasional. 1 glass of wine 2-3 times per month   Drug use: No   Sexual activity: Yes    Partners: Male    Birth control/protection: Post-menopausal  Other Topics Concern   Not on file  Social History Narrative   Not on file   Social Determinants of Health   Financial Resource Strain: Low Risk  (10/06/2017)   Overall Financial Resource Strain (CARDIA)    Difficulty of Paying Living Expenses: Not hard at all  Food Insecurity: No Food Insecurity (10/06/2017)   Hunger Vital Sign    Worried About Running Out of Food in the Last Year: Never true    Ran Out of Food in the Last Year: Never true  Transportation  Needs: No Transportation Needs (10/06/2017)   PRAPARE - Administrator, Civil Service (Medical): No    Lack of Transportation (Non-Medical): No  Physical Activity: Insufficiently Active (10/06/2017)   Exercise Vital Sign    Days of Exercise per Week: 3 days    Minutes of Exercise per Session: 30 min  Stress: Not on file  Social Connections: Not on file  Intimate Partner Violence: Not on file   Family Status  Relation Name Status   Mother  Deceased   Father  Deceased   Sister  Alive   Sister  Alive   Sister  Alive   Sister  Alive   Sister  Alive   Brother  Deceased   MGM  (Not Specified)   MGF  (Not Specified)   PGM  (Not Specified)   Neg Hx  (Not Specified)   Family History  Problem Relation Age of Onset   Arthritis Mother    Heart disease Mother    Hypertension Mother    Hyperlipidemia Mother    Dementia Mother    Hypertension Father    Lung cancer  Father        smoker   Hypertension Sister    Breast cancer Sister 66   Glaucoma Brother    Pancreatic cancer Brother    Asthma Maternal Grandmother    Stomach cancer Maternal Grandfather    Gastric cancer Maternal Grandfather    Arthritis Paternal Grandmother    Colon cancer Neg Hx    No Known Allergies  Patient Care Team: Bacigalupo, Marzella Schlein, MD as PCP - General (Family Medicine) Jeralyn Ruths, MD as Consulting Physician (Oncology)   Medications: Outpatient Medications Prior to Visit  Medication Sig   atorvastatin (LIPITOR) 40 MG tablet Take 1 tablet (40 mg total) by mouth daily.   Blood Glucose Monitoring Suppl (FREESTYLE LITE) DEVI Use to check blood glucose once daily   cetirizine (ZYRTEC) 10 MG tablet Take 10 mg by mouth daily.   clonazePAM (KLONOPIN) 0.5 MG tablet Take 1 tablet (0.5 mg total) by mouth 2 (two) times daily as needed.   Continuous Blood Gluc Receiver (FREESTYLE LIBRE READER) DEVI Use to monitor glucose continuously   Continuous Glucose Sensor (FREESTYLE LIBRE 3 SENSOR) MISC 1 each by Does not apply route every 14 (fourteen) days. Place 1 sensor on the skin every 14 days. Use to check glucose continuously   COVID-19 At Home Antigen Test (CARESTART COVID-19 HOME TEST) KIT Use as directed   empagliflozin (JARDIANCE) 25 MG TABS tablet Take 1 tablet (25 mg total) by mouth daily.   estradiol (ESTRACE) 1 MG tablet Take 1 tablet (1 mg total) by mouth daily.   glucose blood (FREESTYLE LITE) test strip To check blood glucose once daily   insulin glargine-yfgn (SEMGLEE) 100 UNIT/ML Pen Inject 10 Units into the skin daily.   Lancets (FREESTYLE) lancets Use as instructed   lisinopril (ZESTRIL) 10 MG tablet Take 1 tablet (10 mg total) by mouth daily.   metFORMIN (GLUCOPHAGE) 1000 MG tablet TAKE 1 TABLET BY MOUTH 2 TIMES DAILY WITH A MEAL.   Multiple Vitamin (MULTIVITAMIN) tablet Take 1 tablet by mouth daily.   progesterone (PROMETRIUM) 100 MG capsule Take 1 capsule  (100 mg total) by mouth daily. On calendar days 1-12 of each month   triamcinolone ointment (KENALOG) 0.5 % Apply 1 application topically 2 (two) times daily.   No facility-administered medications prior to visit.    Review of Systems  Constitutional:  Negative  for chills, fatigue and fever.  HENT:  Negative for congestion, ear pain, rhinorrhea, sneezing and sore throat.   Eyes: Negative.  Negative for pain and redness.  Respiratory:  Negative for cough, shortness of breath and wheezing.   Cardiovascular:  Negative for chest pain and leg swelling.  Gastrointestinal:  Negative for abdominal pain, blood in stool, constipation, diarrhea and nausea.  Endocrine: Positive for heat intolerance. Negative for polydipsia and polyphagia.  Genitourinary: Negative.  Negative for dysuria, flank pain, hematuria, pelvic pain, vaginal bleeding and vaginal discharge.  Musculoskeletal:  Negative for arthralgias, back pain, gait problem and joint swelling.  Skin:  Positive for rash.  Neurological: Negative.  Negative for dizziness, tremors, seizures, weakness, light-headedness, numbness and headaches.  Hematological:  Negative for adenopathy.  Psychiatric/Behavioral: Negative.  Negative for behavioral problems, confusion and dysphoric mood. The patient is not nervous/anxious and is not hyperactive.    Except see HPI    Objective    BP 107/63   Pulse 85   Temp 98.4 F (36.9 C) (Oral)   Resp 14   Ht 5\' 7"  (1.702 m)   Wt 120 lb 9.6 oz (54.7 kg)   LMP 12/02/2017   SpO2 100% Comment: room air  BMI 18.89 kg/m     Physical Exam Vitals reviewed.  Constitutional:      General: She is not in acute distress.    Appearance: Normal appearance. She is well-developed. She is not ill-appearing, toxic-appearing or diaphoretic.  HENT:     Head: Normocephalic and atraumatic.     Right Ear: Tympanic membrane, ear canal and external ear normal.     Left Ear: Tympanic membrane, ear canal and external ear  normal.     Nose: Congestion and rhinorrhea present.     Mouth/Throat:     Mouth: Mucous membranes are moist.     Pharynx: Oropharynx is clear. No oropharyngeal exudate or posterior oropharyngeal erythema.  Eyes:     General: No scleral icterus.       Right eye: No discharge.        Left eye: No discharge.     Conjunctiva/sclera: Conjunctivae normal.     Pupils: Pupils are equal, round, and reactive to light.  Neck:     Thyroid: No thyromegaly.     Vascular: No carotid bruit.  Cardiovascular:     Rate and Rhythm: Normal rate and regular rhythm.     Pulses: Normal pulses.     Heart sounds: Normal heart sounds. No murmur heard.    No friction rub. No gallop.  Pulmonary:     Effort: Pulmonary effort is normal. No respiratory distress.     Breath sounds: Normal breath sounds. No wheezing or rales.  Abdominal:     General: Abdomen is flat. Bowel sounds are normal. There is no distension.     Palpations: Abdomen is soft. There is no mass.     Tenderness: There is no abdominal tenderness. There is no right CVA tenderness, left CVA tenderness, guarding or rebound.     Hernia: No hernia is present.  Musculoskeletal:        General: No swelling, tenderness, deformity or signs of injury. Normal range of motion.     Cervical back: Normal range of motion and neck supple. No rigidity or tenderness.     Right lower leg: No edema.     Left lower leg: No edema.  Lymphadenopathy:     Cervical: Cervical adenopathy present.  Skin:    General: Skin  is warm and dry.     Coloration: Skin is not jaundiced or pale.     Findings: No bruising, erythema, lesion or rash.  Neurological:     Mental Status: She is alert and oriented to person, place, and time. Mental status is at baseline.     Gait: Gait normal.  Psychiatric:        Mood and Affect: Mood normal.        Behavior: Behavior normal.        Thought Content: Thought content normal.        Judgment: Judgment normal.      Results for orders  placed or performed in visit on 11/18/22  POCT HgB A1C  Result Value Ref Range   Hemoglobin A1C 6.6 (A) 4.0 - 5.6 %   Est. average glucose Bld gHb Est-mCnc 143     Assessment & Plan    Routine Health Maintenance and Physical Exam  Exercise Activities and Dietary recommendations  Goals   None     Immunization History  Administered Date(s) Administered   IPV 10/26/1973, 11/23/1973, 12/21/1973, 08/09/1991   Influenza,inj,Quad PF,6+ Mos 03/17/2017   Influenza-Unspecified 03/22/2014, 03/22/2016, 03/21/2018, 03/26/2020, 03/26/2021, 04/01/2022   MMR 07/04/1985, 08/09/1991, 02/07/1992   PFIZER(Purple Top)SARS-COV-2 Vaccination 06/24/2019, 07/13/2019, 06/09/2020   Pfizer Covid-19 Vaccine Bivalent Booster 34yrs & up 05/22/2021   Pneumococcal Polysaccharide-23 04/02/2014   Td 12/24/2021   Tdap 12/07/2011   Zoster Recombinat (Shingrix) 12/18/2020    Health Maintenance  Topic Date Due   Zoster (Shingles) Vaccine (2 of 2) 02/12/2021   COVID-19 Vaccine (5 - 2023-24 season) 02/20/2022   Yearly kidney health urinalysis for diabetes  12/25/2022   Complete foot exam   12/25/2022   Flu Shot  01/21/2023   Yearly kidney function blood test for diabetes  04/09/2023   Hemoglobin A1C  05/21/2023   Eye exam for diabetics  07/23/2023   Mammogram  07/29/2024   Pap Smear  12/12/2024   Cologuard (Stool DNA test)  04/04/2025   DTaP/Tdap/Td vaccine (3 - Td or Tdap) 12/25/2031   Hepatitis C Screening  Completed   HIV Screening  Completed   HPV Vaccine  Aged Out    Discussed health benefits of physical activity, and encouraged her to engage in regular exercise appropriate for her age and condition.  1. Type 2 diabetes mellitus with hypoglycemia without coma, with long-term current use of insulin (HCC) Chronic and stable Advised to continue with her current regimen Ordered: - Comprehensive metabolic panel - Lipid panel - Urine Microalbumin w/creat. ratio - POCT HgB A1C 6.6 Preferred hold on  TSH and CBC as 6 mo ago they were stable, WNL> Will reassess after  receiving lab results  2. Hyperlipidemia associated with type 2 diabetes mellitus (HCC) Chronic and stable Continue taking current regimen Ordered - Comprehensive metabolic panel - Lipid panel Will reassess after  receiving lab results  3. Annual physical exam UTD on dental/eye Eye exam was done in January Dentist uptodate,  last exam was done Wednesday Things to do to keep yourself healthy  - Exercise at least 30-45 minutes a day, 3-4 days a week.  - Eat a low-fat diet with lots of fruits and vegetables, up to 7-9 servings per day.  - Seatbelts can save your life. Wear them always.  - Smoke detectors on every level of your home, check batteries every year.  - Eye Doctor - have an eye exam every 1-2 years  - Safe sex - if you may  be exposed to STDs, use a condom.  - Alcohol -  If you drink, do it moderately, less than 2 drinks per day.  - Health Care Power of Attorney. Choose someone to speak for you if you are not able.  - Depression is common in our stressful world.If you're feeling down or losing interest in things you normally enjoy, please come in for a visit.  - Violence - If anyone is threatening or hurting you, please call immediately.   Return in about 1 year (around 11/18/2023) for CPE.   The patient was advised to call back or seek an in-person evaluation if the symptoms worsen or if the condition fails to improve as anticipated.  I discussed the assessment and treatment plan with the patient. The patient was provided an opportunity to ask questions and all were answered. The patient agreed with the plan and demonstrated an understanding of the instructions.  I, Debera Lat, PA-C have reviewed all documentation for this visit. The documentation on 11/18/22  for the exam, diagnosis, procedures, and orders are all accurate and complete.  Debera Lat, [facility], MMS [facility] [phone]  (phone) [phone] (fax)  [facility]

## 2022-12-16 ENCOUNTER — Other Ambulatory Visit: Payer: Self-pay

## 2023-01-20 ENCOUNTER — Other Ambulatory Visit: Payer: Self-pay

## 2023-01-20 ENCOUNTER — Other Ambulatory Visit: Payer: Self-pay | Admitting: Physician Assistant

## 2023-01-20 DIAGNOSIS — Z794 Long term (current) use of insulin: Secondary | ICD-10-CM

## 2023-01-20 MED ORDER — METFORMIN HCL 1000 MG PO TABS
ORAL_TABLET | Freq: Two times a day (BID) | ORAL | 1 refills | Status: DC
Start: 2023-01-20 — End: 2023-07-28
  Filled 2023-01-20: qty 180, 90d supply, fill #0
  Filled 2023-04-14: qty 180, 90d supply, fill #1

## 2023-01-21 ENCOUNTER — Other Ambulatory Visit: Payer: Self-pay

## 2023-01-27 ENCOUNTER — Other Ambulatory Visit: Payer: Self-pay | Admitting: Family Medicine

## 2023-01-27 ENCOUNTER — Other Ambulatory Visit: Payer: Self-pay

## 2023-01-27 DIAGNOSIS — Z794 Long term (current) use of insulin: Secondary | ICD-10-CM

## 2023-01-28 ENCOUNTER — Other Ambulatory Visit (HOSPITAL_COMMUNITY): Payer: Self-pay

## 2023-01-28 ENCOUNTER — Other Ambulatory Visit: Payer: Self-pay

## 2023-01-28 MED ORDER — EMPAGLIFLOZIN 25 MG PO TABS
25.0000 mg | ORAL_TABLET | Freq: Every day | ORAL | 1 refills | Status: DC
Start: 2023-01-28 — End: 2023-07-28
  Filled 2023-01-28: qty 90, 90d supply, fill #0
  Filled 2023-04-28: qty 90, 90d supply, fill #1

## 2023-02-10 ENCOUNTER — Other Ambulatory Visit: Payer: Self-pay

## 2023-02-10 ENCOUNTER — Other Ambulatory Visit: Payer: Self-pay | Admitting: Family Medicine

## 2023-02-11 ENCOUNTER — Other Ambulatory Visit: Payer: Self-pay

## 2023-02-11 MED ORDER — ATORVASTATIN CALCIUM 40 MG PO TABS
40.0000 mg | ORAL_TABLET | Freq: Every day | ORAL | 0 refills | Status: DC
  Filled 2023-02-11: qty 90, 90d supply, fill #0

## 2023-02-11 MED ORDER — LISINOPRIL 10 MG PO TABS
10.0000 mg | ORAL_TABLET | Freq: Every day | ORAL | 0 refills | Status: DC
  Filled 2023-02-11: qty 90, 90d supply, fill #0

## 2023-02-12 ENCOUNTER — Other Ambulatory Visit: Payer: Self-pay

## 2023-03-16 ENCOUNTER — Other Ambulatory Visit: Payer: Self-pay

## 2023-03-18 ENCOUNTER — Other Ambulatory Visit: Payer: Self-pay

## 2023-04-01 ENCOUNTER — Ambulatory Visit: Payer: Commercial Managed Care - PPO | Admitting: Family Medicine

## 2023-04-01 ENCOUNTER — Encounter: Payer: Self-pay | Admitting: Family Medicine

## 2023-04-01 ENCOUNTER — Other Ambulatory Visit: Payer: Self-pay | Admitting: Physician Assistant

## 2023-04-01 ENCOUNTER — Other Ambulatory Visit: Payer: Self-pay

## 2023-04-01 VITALS — BP 110/76 | HR 80 | Temp 97.7°F | Resp 16 | Ht 67.0 in | Wt 124.1 lb

## 2023-04-01 DIAGNOSIS — F419 Anxiety disorder, unspecified: Secondary | ICD-10-CM

## 2023-04-01 DIAGNOSIS — R232 Flushing: Secondary | ICD-10-CM

## 2023-04-01 DIAGNOSIS — E11649 Type 2 diabetes mellitus with hypoglycemia without coma: Secondary | ICD-10-CM

## 2023-04-01 DIAGNOSIS — E1169 Type 2 diabetes mellitus with other specified complication: Secondary | ICD-10-CM

## 2023-04-01 DIAGNOSIS — Z794 Long term (current) use of insulin: Secondary | ICD-10-CM

## 2023-04-01 DIAGNOSIS — E785 Hyperlipidemia, unspecified: Secondary | ICD-10-CM

## 2023-04-01 DIAGNOSIS — Z79890 Hormone replacement therapy (postmenopausal): Secondary | ICD-10-CM

## 2023-04-01 MED ORDER — PROGESTERONE MICRONIZED 100 MG PO CAPS
100.0000 mg | ORAL_CAPSULE | Freq: Every day | ORAL | 3 refills | Status: AC
  Filled 2023-04-01: qty 90, 90d supply, fill #0
  Filled 2023-11-16: qty 1, 1d supply, fill #1
  Filled 2023-11-16: qty 89, 89d supply, fill #1
  Filled 2023-11-16: qty 90, 90d supply, fill #1
  Filled 2024-03-25: qty 90, 90d supply, fill #2

## 2023-04-01 MED ORDER — ESTRADIOL 1 MG PO TABS
1.0000 mg | ORAL_TABLET | Freq: Every day | ORAL | 1 refills | Status: DC
  Filled 2023-04-01 – 2023-04-28 (×3): qty 90, 90d supply, fill #0
  Filled 2023-07-28: qty 90, 90d supply, fill #1

## 2023-04-01 MED ORDER — CLONAZEPAM 0.5 MG PO TABS
0.5000 mg | ORAL_TABLET | Freq: Two times a day (BID) | ORAL | 0 refills | Status: DC | PRN
  Filled 2023-04-01: qty 30, 15d supply, fill #0

## 2023-04-01 MED ORDER — INSULIN GLARGINE-YFGN 100 UNIT/ML ~~LOC~~ SOPN
7.0000 [IU] | PEN_INJECTOR | Freq: Every day | SUBCUTANEOUS | 5 refills | Status: DC
  Filled 2023-04-01: qty 9, 90d supply, fill #0
  Filled 2023-07-28: qty 9, 90d supply, fill #1
  Filled 2023-12-15: qty 9, 84d supply, fill #2

## 2023-04-01 NOTE — Progress Notes (Signed)
Established Patient Office Visit  Subjective   Patient ID: Lynn Coleman, female    DOB: 01-24-1971  Age: 52 y.o. MRN: [id number]  Chief Complaint  Patient presents with   Diabetes   Hyperlipidemia     Discussed the use of AI scribe software for clinical note transcription with the patient, who gave verbal consent to proceed.  History of Present Illness   The patient, with a history of diabetes, presents for a routine follow-up. She has been managing her blood sugar levels with the help of a continuous Libre and reports that her blood sugars have not been bad. She has been taking metformin twice a day and insulin at a dose of seven to eight units most days. She reports occasional hypoglycemia but is managing it well.  The patient is also on hormone replacement therapy. She had a reaction to a patch and had to switch back to oral medication. She had a discussion about genetic testing with her provider but decided against it due to personal reasons.  The patient also mentions a concern about her blood pressure due to a high reading. She has been eating a lot of salted nuts and is worried that this might be causing her blood pressure to rise. She requests a recheck of her blood pressure during the visit.         ROS per HPI    Objective:     BP 110/76 (BP Location: Right Arm, Patient Position: Sitting, Cuff Size: Normal)   Pulse 80   Temp 97.7 F (36.5 C)   Resp 16   Ht 5\' 7"  (1.702 m)   Wt 124 lb 1.6 oz (56.3 kg)   LMP 12/02/2017   SpO2 98%   BMI 19.44 kg/m    Physical Exam Vitals reviewed.  Constitutional:      General: She is not in acute distress.    Appearance: Normal appearance. She is well-developed. She is not diaphoretic.  HENT:     Head: Normocephalic and atraumatic.  Eyes:     General: No scleral icterus.    Conjunctiva/sclera: Conjunctivae normal.  Neck:     Thyroid: No thyromegaly.  Cardiovascular:     Rate and Rhythm: Normal rate and regular  rhythm.     Heart sounds: Normal heart sounds. No murmur heard. Pulmonary:     Effort: Pulmonary effort is normal. No respiratory distress.     Breath sounds: Normal breath sounds. No wheezing, rhonchi or rales.  Musculoskeletal:     Cervical back: Neck supple.     Right lower leg: No edema.     Left lower leg: No edema.  Lymphadenopathy:     Cervical: No cervical adenopathy.  Skin:    General: Skin is warm and dry.     Findings: No rash.  Neurological:     Mental Status: She is alert and oriented to person, place, and time. Mental status is at baseline.  Psychiatric:        Mood and Affect: Mood normal.        Behavior: Behavior normal.     Diabetic Foot Exam - Simple   Simple Foot Form Diabetic Foot exam was performed with the following findings: Yes 04/01/2023  8:37 AM  Visual Inspection No deformities, no ulcerations, no other skin breakdown bilaterally: Yes Sensation Testing Intact to touch and monofilament testing bilaterally: Yes Pulse Check Posterior Tibialis and Dorsalis pulse intact bilaterally: Yes Comments       No results found for  any visits on 04/01/23.    The 10-year ASCVD risk score (Arnett DK, et al., 2019) is: 2.6%    Assessment & Plan:   Problem List Items Addressed This Visit       Cardiovascular and Mediastinum   Hot flashes    Hormone Replacement Therapy Patient had a reaction to the patch and switched back to oral medication. GYN previously discussed but pt declined genetic testing. -Continue current regimen.      Relevant Medications   progesterone (PROMETRIUM) 100 MG capsule     Endocrine   T2DM (type 2 diabetes mellitus) (HCC) - Primary    Self-reported A1c of 6.6% over the last 90 days and 6.5% over the last 30 days using [employer]. No hypoglycemic episodes reported. Currently on Metformin BID, Insulin 7-8 units daily, and Jardiance. -Continue current regimen. -Order lab A1c to confirm self-reported values. - UACR -  foot exam completed      Relevant Medications   insulin glargine-yfgn (SEMGLEE) 100 UNIT/ML Pen   Other Relevant Orders   Hemoglobin A1c   Hyperlipidemia associated with type 2 diabetes mellitus (HCC)    Previously well controlled Continue statin Repeat FLP and CMP      Relevant Medications   insulin glargine-yfgn (SEMGLEE) 100 UNIT/ML Pen     Other   Anxiety    Chronic and stable Using clonazepam rarely (1-2 rx per year) Refilled today      Hormone replacement therapy (HRT)   Relevant Medications   estradiol (ESTRACE) 1 MG tablet        General Health Maintenance -Order labs including cholesterol and urine microalbumin. -Administer foot exam (completed during visit). -Flu shot to be administered at work (recorded as completed for record keeping). -Schedule physical exam for May 2025.        Return in about 7 months (around 10/30/2023) for CPE.    Shirlee Latch, MD

## 2023-04-01 NOTE — Assessment & Plan Note (Signed)
Hormone Replacement Therapy Patient had a reaction to the patch and switched back to oral medication. GYN previously discussed but pt declined genetic testing. -Continue current regimen.

## 2023-04-01 NOTE — Assessment & Plan Note (Signed)
Chronic and stable Using clonazepam rarely (1-2 rx per year) Refilled today

## 2023-04-01 NOTE — Assessment & Plan Note (Signed)
Previously well controlled Continue statin Repeat FLP and CMP  

## 2023-04-01 NOTE — Assessment & Plan Note (Signed)
Self-reported A1c of 6.6% over the last 90 days and 6.5% over the last 30 days using [employer]. No hypoglycemic episodes reported. Currently on Metformin BID, Insulin 7-8 units daily, and Jardiance. -Continue current regimen. -Order lab A1c to confirm self-reported values. - UACR - foot exam completed

## 2023-04-02 LAB — LIPID PANEL
Chol/HDL Ratio: 2.4 {ratio} (ref 0.0–4.4)
Cholesterol, Total: 165 mg/dL (ref 100–199)
HDL: 68 mg/dL (ref >39)
LDL Chol Calc (NIH): 86 mg/dL (ref 0–99)
Triglycerides: 51 mg/dL (ref 0–149)
VLDL Cholesterol Cal: 11 mg/dL (ref 5–40)

## 2023-04-02 LAB — COMPREHENSIVE METABOLIC PANEL
ALT: 17 [IU]/L (ref 0–32)
AST: 16 [IU]/L (ref 0–40)
Albumin: 4.4 g/dL (ref 3.8–4.9)
Alkaline Phosphatase: 60 [IU]/L (ref 44–121)
BUN/Creatinine Ratio: 20 (ref 9–23)
BUN: 15 mg/dL (ref 6–24)
Bilirubin Total: 0.3 mg/dL (ref 0.0–1.2)
CO2: 23 mmol/L (ref 20–29)
Calcium: 9.8 mg/dL (ref 8.7–10.2)
Chloride: 101 mmol/L (ref 96–106)
Creatinine, Ser: 0.75 mg/dL (ref 0.57–1.00)
Globulin, Total: 3 g/dL (ref 1.5–4.5)
Glucose: 126 mg/dL — ABNORMAL HIGH (ref 70–99)
Potassium: 4.1 mmol/L (ref 3.5–5.2)
Sodium: 141 mmol/L (ref 134–144)
Total Protein: 7.4 g/dL (ref 6.0–8.5)
eGFR: 95 mL/min/{1.73_m2} (ref >59)

## 2023-04-02 LAB — MICROALBUMIN / CREATININE URINE RATIO
Creatinine, Urine: 54 mg/dL
Microalb/Creat Ratio: <6 mg/g{creat} (ref 0–29)
Microalbumin, Urine: <3 ug/mL

## 2023-04-02 LAB — SPECIMEN STATUS REPORT

## 2023-04-02 LAB — HEMOGLOBIN A1C
Est. average glucose Bld gHb Est-mCnc: 157 mg/dL
Hgb A1c MFr Bld: 7.1 % — ABNORMAL HIGH (ref 4.8–5.6)

## 2023-04-14 ENCOUNTER — Other Ambulatory Visit: Payer: Self-pay

## 2023-04-14 ENCOUNTER — Other Ambulatory Visit: Payer: Self-pay | Admitting: Family Medicine

## 2023-04-15 ENCOUNTER — Other Ambulatory Visit: Payer: Self-pay

## 2023-04-15 MED ORDER — FREESTYLE LIBRE 3 SENSOR MISC
1.0000 | 5 refills | Status: DC
  Filled 2023-04-15: qty 2, 28d supply, fill #0
  Filled 2023-05-26: qty 2, 28d supply, fill #1
  Filled 2023-06-30: qty 2, 28d supply, fill #2
  Filled 2023-08-18: qty 2, 28d supply, fill #3
  Filled 2023-09-15: qty 2, 28d supply, fill #4
  Filled 2023-10-20: qty 2, 28d supply, fill #5

## 2023-04-28 ENCOUNTER — Other Ambulatory Visit: Payer: Self-pay

## 2023-05-26 ENCOUNTER — Other Ambulatory Visit: Payer: Self-pay

## 2023-05-26 ENCOUNTER — Other Ambulatory Visit: Payer: Self-pay | Admitting: Family Medicine

## 2023-05-28 ENCOUNTER — Other Ambulatory Visit: Payer: Self-pay

## 2023-05-28 ENCOUNTER — Other Ambulatory Visit: Payer: Self-pay | Admitting: Family Medicine

## 2023-05-28 MED FILL — Lisinopril Tab 10 MG: ORAL | 90 days supply | Qty: 90 | Fill #0 | Status: AC

## 2023-05-28 MED FILL — Atorvastatin Calcium Tab 40 MG (Base Equivalent): ORAL | 90 days supply | Qty: 90 | Fill #0 | Status: AC

## 2023-05-28 NOTE — Telephone Encounter (Signed)
Requested Prescriptions  Pending Prescriptions Disp Refills   atorvastatin (LIPITOR) 40 MG tablet 90 tablet 2    Sig: Take 1 tablet (40 mg total) by mouth daily.     Cardiovascular:  Antilipid - Statins Failed - 05/28/2023 11:04 AM      Failed - Lipid Panel in normal range within the last 12 months    Cholesterol, Total  Date Value Ref Range Status  04/01/2023 165 100 - 199 mg/dL Final   LDL Chol Calc (NIH)  Date Value Ref Range Status  04/01/2023 86 0 - 99 mg/dL Final   HDL  Date Value Ref Range Status  04/01/2023 68 >39 mg/dL Final   Triglycerides  Date Value Ref Range Status  04/01/2023 51 0 - 149 mg/dL Final         Passed - Patient is not pregnant      Passed - Valid encounter within last 12 months    Recent Outpatient Visits           1 month ago Type 2 diabetes mellitus with hypoglycemia without coma, with long-term current use of insulin (HCC)   [facility] [place], Marzella Schlein, MD   6 months ago Type 2 diabetes mellitus with hypoglycemia without coma, with long-term current use of insulin [facility])   [place] [facility] [place], [place], PA-C   9 months ago Type 2 diabetes mellitus with hypoglycemia without coma, with long-term current use of insulin [facility])   [place] [facility] [place], Marzella Schlein, MD   1 year ago Type 2 diabetes mellitus with hypoglycemia without coma, with long-term current use of insulin [facility])   [place] [facility] [place], Marzella Schlein, MD   1 year ago Type 2 diabetes mellitus with hypoglycemia without coma, with long-term current use of insulin [facility])   [place] [facility] Caro Lar[place], DO       Future Appointments             In 5 months Bacigalupo, Marzella Schlein, MD [facility]             lisinopril (ZESTRIL) 10 MG tablet 90 tablet 0    Sig: Take 1 tablet (10 mg total) by mouth daily.      Cardiovascular:  ACE Inhibitors Passed - 05/28/2023 11:04 AM      Passed - Cr in normal range and within 180 days    Creatinine, Ser  Date Value Ref Range Status  04/01/2023 0.75 0.57 - 1.00 mg/dL Final         Passed - K in normal range and within 180 days    Potassium  Date Value Ref Range Status  04/01/2023 4.1 3.5 - 5.2 mmol/L Final         Passed - Patient is not pregnant      Passed - Last BP in normal range    BP Readings from Last 1 Encounters:  04/01/23 110/76         Passed - Valid encounter within last 6 months    Recent Outpatient Visits           1 month ago Type 2 diabetes mellitus with hypoglycemia without coma, with long-term current use of insulin [facility])   [place] [facility] [place], Marzella Schlein, MD   6 months ago Type 2 diabetes mellitus with hypoglycemia without coma, with long-term current use of insulin (HCC)   [place]  [facility] [place], [place], PA-C   9 months ago Type 2 diabetes mellitus with hypoglycemia without coma, with long-term current use of insulin [facility])   [place] [facility] [place], Marzella Schlein, MD   1 year ago Type 2 diabetes mellitus with hypoglycemia without coma, with long-term current use of insulin [facility])   [place] [facility] [place], Marzella Schlein, MD   1 year ago Type 2 diabetes mellitus with hypoglycemia without coma, with long-term current use of insulin [facility])   [facility] Caro Lar[place], DO       Future Appointments             In 5 months Bacigalupo, Marzella Schlein, MD [facility], [facility]

## 2023-06-30 ENCOUNTER — Other Ambulatory Visit (HOSPITAL_COMMUNITY): Payer: Self-pay

## 2023-06-30 ENCOUNTER — Other Ambulatory Visit: Payer: Self-pay

## 2023-07-28 ENCOUNTER — Other Ambulatory Visit: Payer: Self-pay | Admitting: Family Medicine

## 2023-07-28 ENCOUNTER — Other Ambulatory Visit: Payer: Self-pay

## 2023-07-28 DIAGNOSIS — Z794 Long term (current) use of insulin: Secondary | ICD-10-CM

## 2023-07-28 MED ORDER — METFORMIN HCL 1000 MG PO TABS
ORAL_TABLET | Freq: Two times a day (BID) | ORAL | 1 refills | Status: DC
  Filled 2023-07-28: qty 180, 90d supply, fill #0
  Filled 2023-10-20: qty 180, 90d supply, fill #1

## 2023-07-28 MED ORDER — EMPAGLIFLOZIN 25 MG PO TABS
25.0000 mg | ORAL_TABLET | Freq: Every day | ORAL | 1 refills | Status: DC
  Filled 2023-07-28: qty 90, 90d supply, fill #0
  Filled 2023-10-20: qty 90, 90d supply, fill #1

## 2023-07-29 ENCOUNTER — Other Ambulatory Visit: Payer: Self-pay | Admitting: Family Medicine

## 2023-07-29 DIAGNOSIS — Z1231 Encounter for screening mammogram for malignant neoplasm of breast: Secondary | ICD-10-CM

## 2023-08-18 ENCOUNTER — Other Ambulatory Visit: Payer: Self-pay

## 2023-08-18 ENCOUNTER — Ambulatory Visit
Admission: RE | Admit: 2023-08-18 | Discharge: 2023-08-18 | Disposition: A | Discharge status: Home / Self Care | Payer: Commercial Managed Care - PPO | Source: Ambulatory Visit | Attending: Family Medicine | Admitting: Family Medicine

## 2023-08-18 DIAGNOSIS — Z1231 Encounter for screening mammogram for malignant neoplasm of breast: Secondary | ICD-10-CM | POA: Insufficient documentation

## 2023-08-18 MED FILL — Atorvastatin Calcium Tab 40 MG (Base Equivalent): ORAL | 90 days supply | Qty: 90 | Fill #1 | Status: AC

## 2023-08-18 MED FILL — Lisinopril Tab 10 MG: ORAL | 90 days supply | Qty: 90 | Fill #1 | Status: AC

## 2023-08-23 ENCOUNTER — Encounter: Payer: Self-pay | Admitting: Family Medicine

## 2023-09-01 DIAGNOSIS — H5213 Myopia, bilateral: Secondary | ICD-10-CM | POA: Diagnosis not present

## 2023-09-01 DIAGNOSIS — E119 Type 2 diabetes mellitus without complications: Secondary | ICD-10-CM | POA: Diagnosis not present

## 2023-09-01 DIAGNOSIS — H52223 Regular astigmatism, bilateral: Secondary | ICD-10-CM | POA: Diagnosis not present

## 2023-09-01 DIAGNOSIS — H524 Presbyopia: Secondary | ICD-10-CM | POA: Diagnosis not present

## 2023-09-01 LAB — HM DIABETES EYE EXAM

## 2023-09-15 ENCOUNTER — Other Ambulatory Visit: Payer: Self-pay

## 2023-10-20 ENCOUNTER — Other Ambulatory Visit: Payer: Self-pay

## 2023-10-20 ENCOUNTER — Other Ambulatory Visit: Payer: Self-pay | Admitting: Family Medicine

## 2023-10-20 DIAGNOSIS — Z79890 Hormone replacement therapy (postmenopausal): Secondary | ICD-10-CM

## 2023-10-21 ENCOUNTER — Other Ambulatory Visit: Payer: Self-pay | Admitting: Family Medicine

## 2023-10-21 ENCOUNTER — Other Ambulatory Visit: Payer: Self-pay

## 2023-10-21 DIAGNOSIS — Z79890 Hormone replacement therapy (postmenopausal): Secondary | ICD-10-CM

## 2023-10-22 ENCOUNTER — Other Ambulatory Visit: Payer: Self-pay

## 2023-10-25 ENCOUNTER — Other Ambulatory Visit: Payer: Self-pay

## 2023-10-25 MED FILL — Estradiol Tab 1 MG: ORAL | 90 days supply | Qty: 90 | Fill #0 | Status: AC

## 2023-10-25 NOTE — Telephone Encounter (Signed)
 Requested Prescriptions  Pending Prescriptions Disp Refills   estradiol  (ESTRACE ) 1 MG tablet 90 tablet 0    Sig: Take 1 tablet (1 mg total) by mouth daily.     OB/GYN:  Estrogens Failed - 10/25/2023  8:39 AM      Failed - Valid encounter within last 12 months    Recent Outpatient Visits   None     Future Appointments             In 1 week Bacigalupo, Stan Eans, MD [facility]            Passed - Mammogram is up-to-date per Health Maintenance      Passed - Last BP in normal range    BP Readings from Last 1 Encounters:  04/01/23 110/76

## 2023-11-04 ENCOUNTER — Encounter: Payer: Self-pay | Admitting: Family Medicine

## 2023-11-04 ENCOUNTER — Ambulatory Visit: Payer: Self-pay | Admitting: Family Medicine

## 2023-11-04 ENCOUNTER — Other Ambulatory Visit: Payer: Self-pay

## 2023-11-04 VITALS — BP 116/73 | HR 81 | Ht 67.0 in | Wt 123.1 lb

## 2023-11-04 DIAGNOSIS — Z794 Long term (current) use of insulin: Secondary | ICD-10-CM | POA: Diagnosis not present

## 2023-11-04 DIAGNOSIS — F419 Anxiety disorder, unspecified: Secondary | ICD-10-CM

## 2023-11-04 DIAGNOSIS — E11649 Type 2 diabetes mellitus with hypoglycemia without coma: Secondary | ICD-10-CM | POA: Diagnosis not present

## 2023-11-04 DIAGNOSIS — E785 Hyperlipidemia, unspecified: Secondary | ICD-10-CM | POA: Diagnosis not present

## 2023-11-04 DIAGNOSIS — Z79890 Hormone replacement therapy (postmenopausal): Secondary | ICD-10-CM

## 2023-11-04 DIAGNOSIS — E1169 Type 2 diabetes mellitus with other specified complication: Secondary | ICD-10-CM | POA: Diagnosis not present

## 2023-11-04 DIAGNOSIS — Z0000 Encounter for general adult medical examination without abnormal findings: Secondary | ICD-10-CM

## 2023-11-04 LAB — POCT GLYCOSYLATED HEMOGLOBIN (HGB A1C): Hemoglobin A1C: 6.7 % — AB (ref 4.0–5.6)

## 2023-11-04 MED ORDER — CLONAZEPAM 0.5 MG PO TABS
0.5000 mg | ORAL_TABLET | Freq: Two times a day (BID) | ORAL | 2 refills | Status: DC | PRN
  Filled 2023-11-04: qty 30, 15d supply, fill #0

## 2023-11-04 MED ORDER — FREESTYLE LIBRE 3 PLUS SENSOR MISC
5 refills | Status: AC
  Filled 2023-11-04 – 2023-12-15 (×2): qty 2, 30d supply, fill #0
  Filled 2024-02-02 – 2024-03-01 (×3): qty 2, 30d supply, fill #1
  Filled 2024-03-24 – 2024-03-25 (×2): qty 2, 30d supply, fill #2
  Filled 2024-05-06: qty 2, 30d supply, fill #3
  Filled 2024-06-12: qty 2, 30d supply, fill #4
  Filled 2024-07-15: qty 2, 30d supply, fill #5

## 2023-11-04 NOTE — Progress Notes (Signed)
 Complete physical exam  Patient: Lynn Coleman   DOB: 04-Sep-1970   53 y.o. Female  MRN: [id number]  Subjective:     Chief Complaint  Patient presents with   Medical Management of Chronic Issues   Hyperlipidemia    Lynn Coleman is here for follow up of elevated cholesterol. Compliance with treatment has been excellent. The patient exercises twice a week. Patient denies muscle pain associated with her medications.   Hypertension    Hypertension Patient is here for follow-up of elevated blood pressure. She is exercising and is adherent to a low-salt diet. Cardiovascular risk factors: diabetes mellitus.    Diabetes    Avg blood glucose using CGM 93-173 in the past week. Excessive urinating only reported symptoms. Last eye exam in January.OK to administre pcv 20 due to high risk for diabetes    Lynn Coleman is a 53 y.o. female who presents today for a complete physical exam. She reports consuming a healthy diet. Home exercise routine includes exercising twice weekly but can sometimes do less when she is under a lot of stress. She generally feels well. She reports sleeping fairly well; she does wake up multiple times a night to urinate which is attributed to her water intake and SGLT-2 usage. This is manageable for her. She reports feeling stressed from work but is able to manage it well with exercise and clonazapam when needed. When she is feeling her stress and anxiety, she will take this one or two days in a row and that helps. She does not have additional problems to discuss today.    Most recent fall risk assessment:    11/18/2022   11:08 AM  Fall Risk   Falls in the past year? 0  Number falls in past yr: 0  Injury with Fall? 0  Risk for fall due to : No Fall Risks  Follow up Falls evaluation completed     Most recent depression screenings:    11/04/2023    8:46 AM 04/01/2023    8:18 AM  PHQ 2/9 Scores  PHQ - 2 Score 1 0  PHQ- 9 Score 3 1    Vision:Within  last year  Patient Active Problem List   Diagnosis Date Noted   Unintended weight loss 05/06/2022   Family history of breast cancer 06/04/2021   Hormone replacement therapy (HRT) 06/04/2021   Allergic rhinitis    T2DM (type 2 diabetes mellitus) (HCC) 03/17/2017   Anxiety 03/17/2017   Hyperlipidemia associated with type 2 diabetes mellitus (HCC) 03/17/2017   Hot flashes 03/17/2017   Past Medical History:  Diagnosis Date   Allergic rhinitis    Anxiety    Diabetes mellitus without complication (HCC)    History of chicken pox    Hyperlipidemia       Patient Care Team: Mazie Speed, MD as PCP - General (Family Medicine) Shellie Dials, MD as Consulting Physician (Oncology)   Outpatient Medications Prior to Visit  Medication Sig   atorvastatin  (LIPITOR) 40 MG tablet Take 1 tablet (40 mg total) by mouth daily.   cetirizine (ZYRTEC) 10 MG tablet Take 10 mg by mouth daily.   COVID-19 At Home Antigen Test [facility] COVID-19 HOME TEST) KIT Use as directed   empagliflozin  (JARDIANCE ) 25 MG TABS tablet Take 1 tablet (25 mg total) by mouth daily.   estradiol  (ESTRACE ) 1 MG tablet Take 1 tablet (1 mg total) by mouth daily.   glucose blood (FREESTYLE LITE) test strip To check  blood glucose once daily   insulin  glargine-yfgn (SEMGLEE ) 100 UNIT/ML Pen Inject 7-8 Units into the skin daily.   Lancets (FREESTYLE) lancets Use as instructed   lisinopril  (ZESTRIL ) 10 MG tablet Take 1 tablet (10 mg total) by mouth daily.   metFORMIN  (GLUCOPHAGE ) 1000 MG tablet TAKE 1 TABLET BY MOUTH 2 TIMES DAILY WITH A MEAL.   Multiple Vitamin (MULTIVITAMIN) tablet Take 1 tablet by mouth daily.   progesterone  (PROMETRIUM ) 100 MG capsule Take 1 capsule (100 mg total) by mouth daily. On calendar days 1-12 of each month   triamcinolone  ointment (KENALOG ) 0.5 % Apply 1 application topically 2 (two) times daily.   [DISCONTINUED] Blood Glucose Monitoring Suppl (FREESTYLE LITE) DEVI Use to check blood  glucose once daily   [DISCONTINUED] clonazePAM  (KLONOPIN ) 0.5 MG tablet Take 1 tablet (0.5 mg total) by mouth 2 (two) times daily as needed.   [DISCONTINUED] Continuous Blood Gluc Receiver (FREESTYLE LIBRE READER) DEVI Use to monitor glucose continuously   [DISCONTINUED] Continuous Glucose Sensor (FREESTYLE LIBRE 3 SENSOR) MISC Place 1 sensor on the skin once every 14 days. Use to check glucose continuously   No facility-administered medications prior to visit.    Review of Systems  Constitutional:  Negative for chills, fever and weight loss.  HENT: Negative.    Eyes: Negative.   Respiratory: Negative.    Cardiovascular:  Negative for chest pain, palpitations and leg swelling.  Gastrointestinal: Negative.   Genitourinary:  Positive for frequency and urgency. Negative for dysuria and hematuria.  Musculoskeletal: Negative.   Skin: Negative.   Neurological:  Negative for dizziness, sensory change, weakness and headaches.  Psychiatric/Behavioral:  Negative for depression. The patient is nervous/anxious.        Objective:     BP 116/73 (BP Location: Left Arm, Patient Position: Sitting, Cuff Size: Normal)   Pulse 81   Ht 5\' 7"  (1.702 m)   Wt 123 lb 1.6 oz (55.8 kg)   LMP 12/02/2017   SpO2 100%   BMI 19.28 kg/m  BP Readings from Last 3 Encounters:  11/04/23 116/73  04/01/23 110/76  11/18/22 107/63   Wt Readings from Last 3 Encounters:  11/04/23 123 lb 1.6 oz (55.8 kg)  04/01/23 124 lb 1.6 oz (56.3 kg)  11/18/22 120 lb 9.6 oz (54.7 kg)     Physical Exam Constitutional:      General: She is not in acute distress.    Appearance: Normal appearance. She is normal weight.  HENT:     Head: Normocephalic and atraumatic.     Right Ear: External ear normal.     Left Ear: External ear normal.     Nose: Nose normal.     Mouth/Throat:     Mouth: Mucous membranes are moist.     Pharynx: Oropharynx is clear.  Eyes:     General: No scleral icterus.    Extraocular Movements:  Extraocular movements intact.     Conjunctiva/sclera: Conjunctivae normal.     Pupils: Pupils are equal, round, and reactive to light.  Cardiovascular:     Rate and Rhythm: Normal rate and regular rhythm.     Pulses: Normal pulses.     Heart sounds: No murmur heard.    No friction rub. No gallop.  Pulmonary:     Effort: Pulmonary effort is normal. No respiratory distress.     Breath sounds: Normal breath sounds. No wheezing or rales.  Abdominal:     General: Abdomen is flat. There is no distension.  Palpations: Abdomen is soft.  Musculoskeletal:        General: Normal range of motion.  Skin:    General: Skin is warm and dry.  Neurological:     General: No focal deficit present.     Mental Status: She is alert and oriented to person, place, and time. Mental status is at baseline.     Motor: No weakness.  Psychiatric:        Mood and Affect: Mood normal.        Behavior: Behavior normal.        Thought Content: Thought content normal.        Judgment: Judgment normal.     Results for orders placed or performed in visit on 11/04/23  POCT HgB A1C  Result Value Ref Range   Hemoglobin A1C 6.7 (A) 4.0 - 5.6 %   HbA1c POC (<> result, manual entry)     HbA1c, POC (prediabetic range)     HbA1c, POC (controlled diabetic range)     Last CBC Lab Results  Component Value Date   WBC 5.9 05/06/2022   HGB 13.6 05/06/2022   HCT 41.6 05/06/2022   MCV 85 05/06/2022   MCH 27.8 05/06/2022   RDW 12.5 05/06/2022   PLT 302 05/06/2022   Last metabolic panel Lab Results  Component Value Date   GLUCOSE 126 (H) 04/01/2023   NA 141 04/01/2023   K 4.1 04/01/2023   CL 101 04/01/2023   CO2 23 04/01/2023   BUN 15 04/01/2023   CREATININE 0.75 04/01/2023   EGFR 95 04/01/2023   CALCIUM  9.8 04/01/2023   PROT 7.4 04/01/2023   ALBUMIN 4.4 04/01/2023   LABGLOB 3.0 04/01/2023   AGRATIO 1.4 12/18/2020   BILITOT 0.3 04/01/2023   ALKPHOS 60 04/01/2023   AST 16 04/01/2023   ALT 17 04/01/2023    Last lipids Lab Results  Component Value Date   CHOL 165 04/01/2023   HDL 68 04/01/2023   LDLCALC 86 04/01/2023   TRIG 51 04/01/2023   CHOLHDL 2.4 04/01/2023   Last hemoglobin A1c Lab Results  Component Value Date   HGBA1C 6.7 (A) 11/04/2023      Assessment & Plan:    Routine Health Maintenance and Physical Exam  Immunization History  Administered Date(s) Administered   IPV 10/26/1973, 11/23/1973, 12/21/1973, 08/09/1991   Influenza,inj,Quad PF,6+ Mos 03/17/2017   Influenza-Unspecified 03/22/2014, 03/22/2016, 03/21/2018, 03/26/2020, 03/26/2021, 04/01/2022, 03/31/2023   MMR 07/04/1985, 08/09/1991, 02/07/1992   PFIZER(Purple Top)SARS-COV-2 Vaccination 06/24/2019, 07/13/2019, 06/09/2020   Pfizer Covid-19 Vaccine Bivalent Booster 85yrs & up 05/22/2021   Pneumococcal Polysaccharide-23 04/02/2014   Td 12/24/2021   Tdap 12/07/2011   Zoster Recombinant(Shingrix) 12/18/2020    Health Maintenance  Topic Date Due   Pneumococcal Vaccine 30-80 Years old (2 of 2 - PCV) 04/03/2015   Zoster Vaccines- Shingrix (2 of 2) 02/12/2021   COVID-19 Vaccine (5 - 2024-25 season) 02/21/2023   INFLUENZA VACCINE  01/21/2024   Diabetic kidney evaluation - eGFR measurement  03/31/2024   Diabetic kidney evaluation - Urine ACR  03/31/2024   FOOT EXAM  03/31/2024   HEMOGLOBIN A1C  05/06/2024   OPHTHALMOLOGY EXAM  08/31/2024   Cervical Cancer Screening (HPV/Pap Cotest)  12/12/2024   Fecal DNA (Cologuard)  04/04/2025   MAMMOGRAM  08/17/2025   DTaP/Tdap/Td (3 - Td or Tdap) 12/25/2031   Hepatitis C Screening  Completed   HIV Screening  Completed   HPV VACCINES  Aged Out   Meningococcal B Vaccine  Aged  Out    Discussed health benefits of physical activity, and encouraged her to engage in regular exercise appropriate for her age and condition.  Problem List Items Addressed This Visit       Endocrine   T2DM (type 2 diabetes mellitus) (HCC) - Primary   A1c of 6.7% in the office today  point-of-care. Monitoring shows that she only has low blood sugars around 1% of the time. Currently on Metformin  BID, Insulin  7-8 units daily, and Jardiance . She has been noticing more frequent urination which is attributed to her Jardiance . This is not a major concern for her and doesn't desire to change her medications. -Continue current medications -PCOT A1c obtained        Relevant Orders   POCT HgB A1C (Completed)   Hyperlipidemia associated with type 2 diabetes mellitus (HCC)   Her hyperlipidemia has been well controlled to date. She is eating a healthy diet and exercising around 2 times a week. She has tolerated her statin therapy well with no myopathic symptoms.  -Lipid panel and CMP have been ordered and will follow up with results -continue Lipitor       Relevant Orders   Lipid Panel With LDL/HDL Ratio   Comprehensive metabolic panel with GFR     Other   Anxiety   Anxiety overall is well managed with occasional clonazapam. Work has been a great contributor to stress which she manages with her exercise. She does not wish to be on any additional medical management for her mood and feels the clonazapam does well when she takes it. -Continue clonazapam PRN, refilled today      Hormone replacement therapy (HRT)   She has been doing well on current oral HRT. She feels her vasomotor symptoms are improved around 85% of the time. -Continue oral HRT      Patient will plan to get Pneumococcal vaccine 20 at next physical. Shingles vaccine gave acute inflammatory response for over a week and isn't planning on getting second dose.  Return in about 6 months (around 05/06/2024) for CPE.      Patient seen along with MS[employer] student, Luther Saltness. I personally evaluated this patient along with the student, and verified all aspects of the history, physical exam, and medical decision making as documented by the student. I agree with the student's documentation and have made all necessary  edits.  Kaislee Chao, Stan Eans, MD, MPH [facility]

## 2023-11-04 NOTE — Assessment & Plan Note (Signed)
 Her hyperlipidemia has been well controlled to date. She is eating a healthy diet and exercising around 2 times a week. She has tolerated her statin therapy well with no myopathic symptoms.  -Lipid panel and CMP have been ordered and will follow up with results -continue Lipitor

## 2023-11-04 NOTE — Assessment & Plan Note (Signed)
 A1c of 6.7% in the office today point-of-care. Monitoring shows that she only has low blood sugars around 1% of the time. Currently on Metformin  BID, Insulin  7-8 units daily, and Jardiance . She has been noticing more frequent urination which is attributed to her Jardiance . This is not a major concern for her and doesn't desire to change her medications. -Continue current medications -PCOT A1c obtained

## 2023-11-04 NOTE — Assessment & Plan Note (Signed)
 Anxiety overall is well managed with occasional clonazapam. Work has been a great contributor to stress which she manages with her exercise. She does not wish to be on any additional medical management for her mood and feels the clonazapam does well when she takes it. -Continue clonazapam PRN, refilled today

## 2023-11-04 NOTE — Assessment & Plan Note (Signed)
 She has been doing well on current oral HRT. She feels her vasomotor symptoms are improved around 85% of the time. -Continue oral HRT

## 2023-11-05 ENCOUNTER — Encounter: Payer: Self-pay | Admitting: Family Medicine

## 2023-11-05 NOTE — Addendum Note (Signed)
 Addended by: Mazie Speed on: 11/05/2023 03:24 PM   Modules accepted: Level of Service

## 2023-11-10 ENCOUNTER — Other Ambulatory Visit: Payer: Self-pay

## 2023-11-10 ENCOUNTER — Other Ambulatory Visit: Payer: Self-pay | Admitting: Family Medicine

## 2023-11-10 MED FILL — Atorvastatin Calcium Tab 40 MG (Base Equivalent): ORAL | 90 days supply | Qty: 90 | Fill #2 | Status: AC

## 2023-11-11 ENCOUNTER — Other Ambulatory Visit: Payer: Self-pay

## 2023-11-16 ENCOUNTER — Other Ambulatory Visit (HOSPITAL_COMMUNITY): Payer: Self-pay

## 2023-11-16 ENCOUNTER — Other Ambulatory Visit: Payer: Self-pay

## 2023-11-16 DIAGNOSIS — E1169 Type 2 diabetes mellitus with other specified complication: Secondary | ICD-10-CM | POA: Diagnosis not present

## 2023-11-16 DIAGNOSIS — E785 Hyperlipidemia, unspecified: Secondary | ICD-10-CM | POA: Diagnosis not present

## 2023-11-17 LAB — LIPID PANEL WITH LDL/HDL RATIO
Cholesterol, Total: 164 mg/dL (ref 100–199)
HDL: 68 mg/dL (ref >39)
LDL Chol Calc (NIH): 85 mg/dL (ref 0–99)
LDL/HDL Ratio: 1.3 ratio (ref 0.0–3.2)
Triglycerides: 52 mg/dL (ref 0–149)
VLDL Cholesterol Cal: 11 mg/dL (ref 5–40)

## 2023-11-17 LAB — COMPREHENSIVE METABOLIC PANEL WITH GFR
ALT: 17 IU/L (ref 0–32)
AST: 16 IU/L (ref 0–40)
Albumin: 4.6 g/dL (ref 3.8–4.9)
Alkaline Phosphatase: 69 IU/L (ref 44–121)
BUN/Creatinine Ratio: 27 — ABNORMAL HIGH (ref 9–23)
BUN: 20 mg/dL (ref 6–24)
Bilirubin Total: 0.4 mg/dL (ref 0.0–1.2)
CO2: 23 mmol/L (ref 20–29)
Calcium: 9.8 mg/dL (ref 8.7–10.2)
Chloride: 98 mmol/L (ref 96–106)
Creatinine, Ser: 0.74 mg/dL (ref 0.57–1.00)
Globulin, Total: 2.7 g/dL (ref 1.5–4.5)
Glucose: 137 mg/dL — ABNORMAL HIGH (ref 70–99)
Potassium: 4.9 mmol/L (ref 3.5–5.2)
Sodium: 139 mmol/L (ref 134–144)
Total Protein: 7.3 g/dL (ref 6.0–8.5)
eGFR: 96 mL/min/{1.73_m2} (ref >59)

## 2023-11-18 ENCOUNTER — Ambulatory Visit: Payer: Self-pay | Admitting: Family Medicine

## 2023-12-15 ENCOUNTER — Other Ambulatory Visit: Payer: Self-pay | Admitting: Family Medicine

## 2023-12-15 ENCOUNTER — Other Ambulatory Visit: Payer: Self-pay

## 2023-12-15 MED ORDER — LISINOPRIL 10 MG PO TABS
10.0000 mg | ORAL_TABLET | Freq: Every day | ORAL | 1 refills | Status: DC
  Filled 2023-12-15: qty 90, 90d supply, fill #0
  Filled 2024-03-24 – 2024-03-25 (×2): qty 90, 90d supply, fill #1

## 2024-01-05 ENCOUNTER — Encounter: Admitting: Physician Assistant

## 2024-01-19 ENCOUNTER — Other Ambulatory Visit: Payer: Self-pay

## 2024-01-19 ENCOUNTER — Other Ambulatory Visit: Payer: Self-pay | Admitting: Family Medicine

## 2024-01-19 ENCOUNTER — Other Ambulatory Visit (HOSPITAL_BASED_OUTPATIENT_CLINIC_OR_DEPARTMENT_OTHER): Payer: Self-pay

## 2024-01-19 DIAGNOSIS — Z79890 Hormone replacement therapy (postmenopausal): Secondary | ICD-10-CM

## 2024-01-19 DIAGNOSIS — Z794 Long term (current) use of insulin: Secondary | ICD-10-CM

## 2024-01-19 MED ORDER — ESTRADIOL 1 MG PO TABS
1.0000 mg | ORAL_TABLET | Freq: Every day | ORAL | 0 refills | Status: DC
  Filled 2024-01-19: qty 90, 90d supply, fill #0

## 2024-01-19 MED ORDER — METFORMIN HCL 1000 MG PO TABS
1000.0000 mg | ORAL_TABLET | Freq: Two times a day (BID) | ORAL | 1 refills | Status: AC
  Filled 2024-01-19: qty 180, 90d supply, fill #0
  Filled 2024-05-06: qty 180, 90d supply, fill #1

## 2024-01-19 MED ORDER — EMPAGLIFLOZIN 25 MG PO TABS
25.0000 mg | ORAL_TABLET | Freq: Every day | ORAL | 1 refills | Status: DC
  Filled 2024-01-19: qty 90, 90d supply, fill #0
  Filled 2024-04-26: qty 90, 90d supply, fill #1

## 2024-02-02 ENCOUNTER — Other Ambulatory Visit: Payer: Self-pay

## 2024-02-14 ENCOUNTER — Other Ambulatory Visit: Payer: Self-pay

## 2024-02-16 ENCOUNTER — Encounter: Payer: Self-pay | Admitting: Sports Medicine

## 2024-03-01 ENCOUNTER — Other Ambulatory Visit: Payer: Self-pay

## 2024-03-01 MED FILL — Atorvastatin Calcium Tab 40 MG (Base Equivalent): ORAL | 90 days supply | Qty: 90 | Fill #3 | Status: AC

## 2024-03-24 ENCOUNTER — Other Ambulatory Visit: Payer: Self-pay

## 2024-04-26 ENCOUNTER — Other Ambulatory Visit: Payer: Self-pay | Admitting: Family Medicine

## 2024-04-26 ENCOUNTER — Other Ambulatory Visit: Payer: Self-pay

## 2024-04-27 ENCOUNTER — Other Ambulatory Visit: Payer: Self-pay

## 2024-04-27 NOTE — Telephone Encounter (Signed)
 Requested medication (s) are due for refill today: yes  Requested medication (s) are on the active medication list: yes  Last refill:    Future visit scheduled: yes  Notes to clinic:    Pharmacy comment: Semglee  currently unavailable. Please change to Basaglar , Toujeo , or Tresiba       Requested Prescriptions  Pending Prescriptions Disp Refills   insulin  glargine-yfgn (SEMGLEE , YFGN,) 100 UNIT/ML Pen 15 mL 5    Sig: Inject 7-8 Units into the skin daily.     Off-Protocol Failed - 04/27/2024  4:26 PM      Failed - Medication not assigned to a protocol, review manually.      Passed - Valid encounter within last 12 months    Recent Outpatient Visits           5 months ago Encounter for annual physical exam   [facility] [place], Jon HERO, MD

## 2024-04-28 ENCOUNTER — Other Ambulatory Visit: Payer: Self-pay

## 2024-04-28 DIAGNOSIS — E11649 Type 2 diabetes mellitus with hypoglycemia without coma: Secondary | ICD-10-CM

## 2024-04-28 MED ORDER — BASAGLAR KWIKPEN 100 UNIT/ML ~~LOC~~ SOPN
7.0000 [IU] | PEN_INJECTOR | Freq: Every day | SUBCUTANEOUS | 3 refills | Status: DC
  Filled 2024-04-28: qty 15, 90d supply, fill #0
  Filled 2024-04-29: qty 6, 75d supply, fill #0

## 2024-04-28 NOTE — Telephone Encounter (Signed)
 Yes, just sent in Basaglar  instead.

## 2024-04-29 ENCOUNTER — Other Ambulatory Visit: Payer: Self-pay

## 2024-04-30 ENCOUNTER — Other Ambulatory Visit: Payer: Self-pay

## 2024-04-30 DIAGNOSIS — Z794 Long term (current) use of insulin: Secondary | ICD-10-CM

## 2024-04-30 MED ORDER — INSULIN DEGLUDEC 100 UNIT/ML ~~LOC~~ SOPN
7.0000 [IU] | PEN_INJECTOR | Freq: Every day | SUBCUTANEOUS | 3 refills | Status: AC
  Filled 2024-04-30: qty 3, 37d supply, fill #0
  Filled 2024-06-26: qty 9, 90d supply, fill #1
  Filled 2024-06-26: qty 3, 37d supply, fill #1
  Filled 2024-06-26: qty 9, 90d supply, fill #1

## 2024-05-01 ENCOUNTER — Other Ambulatory Visit: Payer: Self-pay

## 2024-05-01 ENCOUNTER — Other Ambulatory Visit (HOSPITAL_COMMUNITY): Payer: Self-pay

## 2024-05-04 ENCOUNTER — Encounter: Payer: Self-pay | Admitting: Family Medicine

## 2024-05-04 ENCOUNTER — Ambulatory Visit: Admitting: Family Medicine

## 2024-05-04 VITALS — BP 116/62 | HR 92 | Ht 65.0 in | Wt 125.3 lb

## 2024-05-04 DIAGNOSIS — F419 Anxiety disorder, unspecified: Secondary | ICD-10-CM

## 2024-05-04 DIAGNOSIS — E11649 Type 2 diabetes mellitus with hypoglycemia without coma: Secondary | ICD-10-CM | POA: Diagnosis not present

## 2024-05-04 DIAGNOSIS — E1169 Type 2 diabetes mellitus with other specified complication: Secondary | ICD-10-CM | POA: Diagnosis not present

## 2024-05-04 DIAGNOSIS — Z794 Long term (current) use of insulin: Secondary | ICD-10-CM | POA: Diagnosis not present

## 2024-05-04 DIAGNOSIS — Z23 Encounter for immunization: Secondary | ICD-10-CM | POA: Diagnosis not present

## 2024-05-04 DIAGNOSIS — E785 Hyperlipidemia, unspecified: Secondary | ICD-10-CM | POA: Diagnosis not present

## 2024-05-04 LAB — POCT GLYCOSYLATED HEMOGLOBIN (HGB A1C): Hemoglobin A1C: 7.1 % — AB (ref 4.0–5.6)

## 2024-05-04 NOTE — Assessment & Plan Note (Signed)
 Fluctuating A1c levels, currently at 7.1%. Morning glucose levels are well-controlled, but postprandial spikes occur, particularly after meals high in processed carbohydrates. She is using a Libre for glucose monitoring and is aware of dietary adjustments needed to manage postprandial spikes. No urgent need to adjust medications as she is close to target A1c levels. - Continue current medications: Jardiance  25 mg daily, Tresiba 7-8 units daily, Metformin  1000 mg BID. - Encouraged dietary adjustments: increase protein and fiber intake, reduce carbohydrate intake, especially processed foods. - Performed foot exam today. - Obtained urine sample for UACR today.

## 2024-05-04 NOTE — Assessment & Plan Note (Signed)
 Anxiety overall is well managed with occasional clonazapam.  -Continue clonazapam PRN, refilled today

## 2024-05-04 NOTE — Assessment & Plan Note (Signed)
 Managed with atorvastatin  40 mg daily. Previous cholesterol and CMP labs were within normal limits. - Continue atorvastatin  40 mg daily. - Will consider annual cholesterol and CMP labs.

## 2024-05-04 NOTE — Progress Notes (Signed)
 Established patient visit   Patient: Lynn Coleman   DOB: 1970/11/29   53 y.o. Female  MRN: [id number] Visit Date: 05/04/2024  Today's healthcare provider: Jon Eva, MD   Chief Complaint  Patient presents with   Medical Management of Chronic Issues    Patient reports taking medications as prescribed with no symptoms to report. She is still using her freestyle libre, she has not been monitoring her blood pressure recently, she does not smoke, she is consuming a diabetic diet, she is exercising three times a week for 30-45 minutes. No hypoglycemic episodes recently (in the last 2 weeks).    Diabetes   Hyperlipidemia   Hypertension   Subjective    Diabetes  Hyperlipidemia  Hypertension   HPI     Medical Management of Chronic Issues    Additional comments: Patient reports taking medications as prescribed with no symptoms to report. She is still using her freestyle libre, she has not been monitoring her blood pressure recently, she does not smoke, she is consuming a diabetic diet, she is exercising three times a week for 30-45 minutes. No hypoglycemic episodes recently (in the last 2 weeks).       Last edited by Lilian Fitzpatrick, CMA on 05/04/2024  8:28 AM.       Discussed the use of AI scribe software for clinical note transcription with the patient, who gave verbal consent to proceed.  History of Present Illness   Lynn Coleman is a 53 year old female with type 2 diabetes and hyperlipidemia who presents for chronic follow-up.  She is on Jardiance  25 mg daily, Tresiba 7-8 units daily, metformin  1000 mg twice daily, and atorvastatin  40 mg daily. She has not picked up her [place] due to a back order and is using leftover Semglee . Her A1c is 7.1, with fluctuations around this level. She uses a Therapist, Art for monitoring, with morning glucose levels under 100 and post-meal spikes up to 200-250, especially after processed foods.  She brings lunch from home, usually  a salad with fresh fruit and water. Dinner and post-dinner snacking are challenging. Her husband, also diabetic, is supposed to eat low carb but craves sugar.         Medications: Outpatient Medications Prior to Visit  Medication Sig   atorvastatin  (LIPITOR) 40 MG tablet Take 1 tablet (40 mg total) by mouth daily.   cetirizine (ZYRTEC) 10 MG tablet Take 10 mg by mouth daily.   clonazePAM  (KLONOPIN ) 0.5 MG tablet Take 1 tablet (0.5 mg total) by mouth 2 (two) times daily as needed.   Continuous Glucose Sensor (FREESTYLE LIBRE 3 PLUS SENSOR) MISC Change sensor every 15 days.   COVID-19 At Home Antigen Test [facility] COVID-19 HOME TEST) KIT Use as directed   empagliflozin  (JARDIANCE ) 25 MG TABS tablet Take 1 tablet (25 mg total) by mouth daily.   estradiol  (ESTRACE ) 1 MG tablet Take 1 tablet (1 mg total) by mouth daily.   glucose blood (FREESTYLE LITE) test strip To check blood glucose once daily   insulin  degludec (TRESIBA) 100 UNIT/ML FlexTouch Pen Inject 7-8 Units into the skin daily.   Lancets (FREESTYLE) lancets Use as instructed   lisinopril  (ZESTRIL ) 10 MG tablet Take 1 tablet (10 mg total) by mouth daily.   metFORMIN  (GLUCOPHAGE ) 1000 MG tablet Take 1 tablet (1,000 mg total) by mouth 2 (two) times daily with a meal.   Multiple Vitamin (MULTIVITAMIN) tablet Take 1 tablet by mouth daily.   progesterone  (PROMETRIUM )  100 MG capsule Take 1 capsule (100 mg total) by mouth daily. On calendar days 1-12 of each month   triamcinolone  ointment (KENALOG ) 0.5 % Apply 1 application topically 2 (two) times daily. (Patient not taking: Reported on 05/04/2024)   No facility-administered medications prior to visit.    Review of Systems     Objective    BP 116/62 (BP Location: Right Arm, Patient Position: Sitting, Cuff Size: Normal)   Pulse 92   Ht 5' 5 (1.651 m)   Wt 125 lb 4.8 oz (56.8 kg)   LMP 12/02/2017   SpO2 100%   BMI 20.85 kg/m    Physical Exam Vitals reviewed.   Constitutional:      General: She is not in acute distress.    Appearance: Normal appearance. She is well-developed. She is not diaphoretic.  HENT:     Head: Normocephalic and atraumatic.  Eyes:     General: No scleral icterus.    Conjunctiva/sclera: Conjunctivae normal.  Cardiovascular:     Rate and Rhythm: Normal rate and regular rhythm.     Heart sounds: Normal heart sounds. No murmur heard. Pulmonary:     Effort: Pulmonary effort is normal. No respiratory distress.     Breath sounds: Normal breath sounds. No wheezing, rhonchi or rales.  Musculoskeletal:     Right lower leg: No edema.     Left lower leg: No edema.  Skin:    General: Skin is warm and dry.  Neurological:     Mental Status: She is alert. Mental status is at baseline.  Psychiatric:        Mood and Affect: Mood normal.        Behavior: Behavior normal.      Results for orders placed or performed in visit on 05/04/24  POCT glycosylated hemoglobin (Hb A1C)  Result Value Ref Range   Hemoglobin A1C 7.1 (A) 4.0 - 5.6 %   HbA1c POC (<> result, manual entry)     HbA1c, POC (prediabetic range)     HbA1c, POC (controlled diabetic range)      Assessment & Plan     Problem List Items Addressed This Visit       Endocrine   T2DM (type 2 diabetes mellitus) (HCC) - Primary   Fluctuating A1c levels, currently at 7.1%. Morning glucose levels are well-controlled, but postprandial spikes occur, particularly after meals high in processed carbohydrates. She is using a Libre for glucose monitoring and is aware of dietary adjustments needed to manage postprandial spikes. No urgent need to adjust medications as she is close to target A1c levels. - Continue current medications: Jardiance  25 mg daily, Tresiba 7-8 units daily, Metformin  1000 mg BID. - Encouraged dietary adjustments: increase protein and fiber intake, reduce carbohydrate intake, especially processed foods. - Performed foot exam today. - Obtained urine sample for  UACR today.      Relevant Orders   Urine microalbumin-creatinine with uACR   POCT glycosylated hemoglobin (Hb A1C) (Completed)   Hyperlipidemia associated with type 2 diabetes mellitus (HCC)   Managed with atorvastatin  40 mg daily. Previous cholesterol and CMP labs were within normal limits. - Continue atorvastatin  40 mg daily. - Will consider annual cholesterol and CMP labs.        Other   Anxiety   Anxiety overall is well managed with occasional clonazapam.  -Continue clonazapam PRN, refilled today      Other Visit Diagnoses       Immunization due  Relevant Orders   Pneumococcal conjugate vaccine 20-valent (Completed)           Immunization: Prevnar 20 Due for Prevnar 20 vaccination. She declined the second dose of Shingrix due to adverse effects from the first dose. - Administered Prevnar 20 vaccine today.        Return in about 6 months (around 11/01/2024) for CPE.       Jon Eva, MD  [facility] [phone] (phone) [phone] (fax)  [facility]

## 2024-05-05 LAB — MICROALBUMIN / CREATININE URINE RATIO
Creatinine, Urine: 53.9 mg/dL
Microalb/Creat Ratio: <6 mg/g{creat} (ref 0–29)
Microalbumin, Urine: <3 ug/mL

## 2024-05-05 LAB — SPECIMEN STATUS REPORT

## 2024-05-06 ENCOUNTER — Other Ambulatory Visit: Payer: Self-pay | Admitting: Family Medicine

## 2024-05-06 ENCOUNTER — Other Ambulatory Visit: Payer: Self-pay

## 2024-05-06 DIAGNOSIS — Z79890 Hormone replacement therapy (postmenopausal): Secondary | ICD-10-CM

## 2024-05-07 ENCOUNTER — Other Ambulatory Visit: Payer: Self-pay | Admitting: Family Medicine

## 2024-05-07 ENCOUNTER — Other Ambulatory Visit: Payer: Self-pay

## 2024-05-07 DIAGNOSIS — Z79890 Hormone replacement therapy (postmenopausal): Secondary | ICD-10-CM

## 2024-05-08 ENCOUNTER — Ambulatory Visit: Payer: Self-pay | Admitting: Family Medicine

## 2024-05-08 ENCOUNTER — Other Ambulatory Visit: Payer: Self-pay

## 2024-05-08 MED FILL — Estradiol Tab 1 MG: ORAL | 90 days supply | Qty: 90 | Fill #0 | Status: AC

## 2024-05-08 MED FILL — Atorvastatin Calcium Tab 40 MG (Base Equivalent): ORAL | 90 days supply | Qty: 90 | Fill #0 | Status: CN

## 2024-05-08 NOTE — Telephone Encounter (Signed)
 LOV 05/04/24 NOV 11/09/24 LRF 01/19/24 qty:90 r:0

## 2024-05-09 ENCOUNTER — Other Ambulatory Visit: Payer: Self-pay

## 2024-05-10 ENCOUNTER — Other Ambulatory Visit: Payer: Self-pay

## 2024-06-12 ENCOUNTER — Other Ambulatory Visit: Payer: Self-pay | Admitting: Family Medicine

## 2024-06-12 ENCOUNTER — Other Ambulatory Visit: Payer: Self-pay

## 2024-06-12 MED ORDER — LISINOPRIL 10 MG PO TABS
10.0000 mg | ORAL_TABLET | Freq: Every day | ORAL | 1 refills | Status: AC
  Filled 2024-06-12: qty 90, 90d supply, fill #0

## 2024-06-12 MED FILL — Atorvastatin Calcium Tab 40 MG (Base Equivalent): ORAL | 90 days supply | Qty: 90 | Fill #0 | Status: AC

## 2024-06-26 ENCOUNTER — Other Ambulatory Visit: Payer: Self-pay | Admitting: Family Medicine

## 2024-06-26 ENCOUNTER — Other Ambulatory Visit: Payer: Self-pay

## 2024-06-26 MED ORDER — CLONAZEPAM 0.5 MG PO TABS
0.5000 mg | ORAL_TABLET | Freq: Two times a day (BID) | ORAL | 2 refills | Status: AC | PRN
  Filled 2024-06-26: qty 30, 15d supply, fill #0

## 2024-07-15 ENCOUNTER — Other Ambulatory Visit: Payer: Self-pay

## 2024-07-17 ENCOUNTER — Other Ambulatory Visit: Payer: Self-pay

## 2024-07-24 ENCOUNTER — Other Ambulatory Visit: Payer: Self-pay

## 2024-07-24 ENCOUNTER — Other Ambulatory Visit (HOSPITAL_COMMUNITY): Payer: Self-pay

## 2024-07-24 ENCOUNTER — Other Ambulatory Visit: Payer: Self-pay | Admitting: Family Medicine

## 2024-07-24 DIAGNOSIS — E11649 Type 2 diabetes mellitus with hypoglycemia without coma: Secondary | ICD-10-CM

## 2024-07-24 MED ORDER — EMPAGLIFLOZIN 25 MG PO TABS
25.0000 mg | ORAL_TABLET | Freq: Every day | ORAL | 1 refills | Status: AC
  Filled 2024-07-24 (×3): qty 90, 90d supply, fill #0

## 2024-07-25 ENCOUNTER — Other Ambulatory Visit: Payer: Self-pay

## 2024-07-26 ENCOUNTER — Other Ambulatory Visit (HOSPITAL_COMMUNITY): Payer: Self-pay

## 2024-09-28 ENCOUNTER — Ambulatory Visit: Payer: 59 | Admitting: Family Medicine

## 2024-11-09 ENCOUNTER — Encounter: Admitting: Family Medicine

## 2025-02-14 ENCOUNTER — Ambulatory Visit: Payer: 59 | Admitting: Family Medicine
